# Patient Record
Sex: Male | Born: 1960 | Race: White | Hispanic: No | Smoking: Current some day smoker
Health system: Southern US, Community
[De-identification: ages and names within clinical notes are randomized; demographics above are authoritative.]

## PROBLEM LIST (undated history)

## (undated) ENCOUNTER — Emergency Department (HOSPITAL_COMMUNITY): Discharge status: Absent without leave | Payer: Medicaid Other | Source: Home / Self Care

---

## 2019-01-24 ENCOUNTER — Ambulatory Visit: Payer: Self-pay | Admitting: Internal Medicine

## 2019-05-17 ENCOUNTER — Ambulatory Visit: Payer: Self-pay | Attending: Internal Medicine

## 2019-05-17 DIAGNOSIS — Z23 Encounter for immunization: Secondary | ICD-10-CM

## 2019-05-17 NOTE — Progress Notes (Signed)
   [id number]  Name:  Romulo Okray.    MRN: [id number] DOB: January 21, 1961  05/17/2019  Mr. Adebayo was observed post Covid-19 immunization for 15 minutes without incident. He was provided with Vaccine Information Sheet and instruction to access the V-Safe system.   Mr. Bowsher was instructed to call 911 with any severe reactions post vaccine: Marland Kitchen Difficulty breathing  . Swelling of face and throat  . A fast heartbeat  . A bad rash all over body  . Dizziness and weakness   Immunizations Administered    Name Date Dose VIS Date Route   Pfizer COVID-19 Vaccine 05/17/2019  1:59 PM 0.3 mL 01/19/2019 Intramuscular   Manufacturer: [employer], [employer]   Lot: [id number]   NDC: [address]-0

## 2019-05-29 ENCOUNTER — Other Ambulatory Visit: Payer: Self-pay

## 2019-05-29 ENCOUNTER — Encounter (HOSPITAL_COMMUNITY): Payer: Self-pay | Admitting: Emergency Medicine

## 2019-05-29 ENCOUNTER — Emergency Department (HOSPITAL_COMMUNITY)
Admission: EM | Admit: 2019-05-29 | Discharge: 2019-05-29 | Disposition: A | Discharge status: Home / Self Care | Payer: Self-pay | Attending: Emergency Medicine | Admitting: Emergency Medicine

## 2019-05-29 DIAGNOSIS — M542 Cervicalgia: Secondary | ICD-10-CM | POA: Insufficient documentation

## 2019-05-29 DIAGNOSIS — Z5321 Procedure and treatment not carried out due to patient leaving prior to being seen by health care provider: Secondary | ICD-10-CM | POA: Insufficient documentation

## 2019-05-29 NOTE — ED Triage Notes (Signed)
Per pt, states he has a pinched nerve in his neck for over a month-left neck pain radiating down left arm-states his PCP gave him a script which helped-states increased pain

## 2019-06-11 ENCOUNTER — Ambulatory Visit: Payer: Self-pay | Attending: Internal Medicine

## 2019-06-11 DIAGNOSIS — Z23 Encounter for immunization: Secondary | ICD-10-CM

## 2019-06-11 NOTE — Progress Notes (Signed)
   [id number]  Name:  Dylan Duke.    MRN: [id number] DOB: 25-Apr-1960  06/11/2019  Mr. [name] was observed post Covid-19 immunization for 15 minutes without incident. He was provided with Vaccine Information Sheet and instruction to access the V-Safe system.   Mr. [name] was instructed to call 911 with any severe reactions post vaccine: Marland Kitchen Difficulty breathing  . Swelling of face and throat  . A fast heartbeat  . A bad rash all over body  . Dizziness and weakness   Immunizations Administered    Name Date Dose VIS Date Route   Pfizer COVID-19 Vaccine 06/11/2019  8:55 AM 0.3 mL 04/04/2018 Intramuscular   Manufacturer: [employer], [employer]   Lot: [id number]   NDC: [address]-4

## 2019-06-27 ENCOUNTER — Encounter (HOSPITAL_COMMUNITY): Payer: Self-pay | Admitting: Emergency Medicine

## 2019-06-27 ENCOUNTER — Other Ambulatory Visit: Payer: Self-pay

## 2019-06-27 ENCOUNTER — Emergency Department (HOSPITAL_COMMUNITY): Payer: Self-pay

## 2019-06-27 ENCOUNTER — Emergency Department (HOSPITAL_COMMUNITY)
Admission: EM | Admit: 2019-06-27 | Discharge: 2019-06-27 | Disposition: A | Discharge status: Home / Self Care | Payer: Self-pay | Attending: Emergency Medicine | Admitting: Emergency Medicine

## 2019-06-27 DIAGNOSIS — M5412 Radiculopathy, cervical region: Secondary | ICD-10-CM | POA: Insufficient documentation

## 2019-06-27 MED ORDER — PREDNISONE 10 MG PO TABS: 20.0000 mg | ORAL_TABLET | Freq: Every day | ORAL | 0 refills | Status: DC

## 2019-06-27 MED ORDER — TRAMADOL HCL 50 MG PO TABS: 50.0000 mg | ORAL_TABLET | Freq: Four times a day (QID) | ORAL | 0 refills | Status: DC | PRN

## 2019-06-27 NOTE — Discharge Instructions (Signed)
Follow-up with Dr. Maisie Fus or one of his colleagues at [facility] neurosurgery

## 2019-06-27 NOTE — ED Provider Notes (Signed)
[facility]-EMERGENCY DEPT Provider Note   CSN: [id number] Arrival date & time: 06/27/19  0935     History No chief complaint on file.   Dylan Duke. is a 59 y.o. male.  Patient states he has pain in the back of his neck rating down his left arm.  He has no weakness.  Patient complains of mild numbness  The history is provided by the patient. No language interpreter was used.  Hand Pain This is a new problem. The current episode started more than 2 days ago. The problem occurs constantly. The problem has not changed since onset.Pertinent negatives include no chest pain, no abdominal pain and no headaches. Nothing aggravates the symptoms. He has tried nothing for the symptoms. The treatment provided no relief.       History reviewed. No pertinent past medical history.  There are no problems to display for this patient.   History reviewed. No pertinent surgical history.     No family history on file.  Social History   Tobacco Use  . Smoking status: Not on file  Substance Use Topics  . Alcohol use: Not Currently  . Drug use: Never    Home Medications Prior to Admission medications   Medication Sig Start Date End Date Taking? Authorizing Provider  predniSONE (DELTASONE) 10 MG tablet Take 2 tablets (20 mg total) by mouth daily. 06/27/19   Bethann Berkshire, MD  traMADol (ULTRAM) 50 MG tablet Take 1 tablet (50 mg total) by mouth every 6 (six) hours as needed. 06/27/19   Bethann Berkshire, MD    Allergies    Penicillins  Review of Systems   Review of Systems  Constitutional: Negative for appetite change and fatigue.  HENT: Negative for congestion, ear discharge and sinus pressure.   Eyes: Negative for discharge.  Respiratory: Negative for cough.   Cardiovascular: Negative for chest pain.  Gastrointestinal: Negative for abdominal pain and diarrhea.  Genitourinary: Negative for frequency and hematuria.  Musculoskeletal: Negative for back pain.         Neck pain,, pain down left arm  Skin: Negative for rash.  Neurological: Negative for seizures and headaches.  Psychiatric/Behavioral: Negative for hallucinations.    Physical Exam Updated Vital Signs BP 137/79   Pulse (!) 53   Temp 97.6 F (36.4 C) (Oral)   Resp 17   SpO2 96%   Physical Exam Vitals and nursing note reviewed.  Constitutional:      Appearance: He is well-developed.  HENT:     Head: Normocephalic.     Nose: Nose normal.  Eyes:     General: No scleral icterus.    Conjunctiva/sclera: Conjunctivae normal.  Neck:     Thyroid: No thyromegaly.  Cardiovascular:     Rate and Rhythm: Normal rate and regular rhythm.     Heart sounds: No murmur. No friction rub. No gallop.   Pulmonary:     Breath sounds: No stridor. No wheezing or rales.  Chest:     Chest wall: No tenderness.  Abdominal:     General: There is no distension.     Tenderness: There is no abdominal tenderness. There is no rebound.  Musculoskeletal:        General: Normal range of motion.     Cervical back: Neck supple.  Lymphadenopathy:     Cervical: No cervical adenopathy.  Skin:    Findings: No erythema or rash.  Neurological:     Mental Status: He is alert and oriented to person,  place, and time.     Motor: No abnormal muscle tone.     Coordination: Coordination normal.  Psychiatric:        Behavior: Behavior normal.     ED Results / Procedures / Treatments   Labs (all labs ordered are listed, but only abnormal results are displayed) Labs Reviewed - No data to display  EKG None  Radiology DG Cervical Spine Complete  Result Date: 06/27/2019 CLINICAL DATA:  Pain to RIGHT side of neck and shoulder. No history of injury. EXAM: CERVICAL SPINE - COMPLETE 4+ VIEW COMPARISON:  None FINDINGS: Alignment is normal. Prevertebral soft tissues are unremarkable. Disc space narrowing is present at C4-5, C5-6 and C6-7 greatest at C6-7. Neural foraminal narrowing mild-to-moderate at C6-7 on the  LEFT and moderate to marked at C6-7 on the RIGHT. Facet arthropathy in the mid cervical spine greatest on the RIGHT. No sign of fracture or malalignment. IMPRESSION: Degenerative changes associated with neural foraminal narrowing the lower cervical spine bilaterally as described. Electronically Signed   By: Zetta Bills M.D.   On: 06/27/2019 10:48    Procedures Procedures (including critical care time)  Medications Ordered in ED Medications - No data to display  ED Course  I have reviewed the triage vital signs and the nursing notes.  Pertinent labs & imaging results that were available during my care of the patient were reviewed by me and considered in my medical decision making (see chart for details).    MDM Rules/Calculators/A&P                     Patient complains of left arm pain and numbness.  Cervical spine x-ray showed arthritis that could be causing a radiculopathy.  Patient put on prednisone Ultram and referred to neurosurgery      This patient presents to the ED for concern of neck pain, this involves an extensive number of treatment options, and is a complaint that carries with it a high risk of complications and morbidity.  The differential diagnosis includes cervical radiculopathy.  Cancer.   Lab Tests:   Medicines ordered:     Imaging Studies ordered:   I ordered imaging studies which included cervical spine films and  I independently visualized and interpreted imaging which showed arthritis  Additional history obtained:   Additional history obtained from records  Previous records obtained and reviewed   Consultations Obtained:   Reevaluation:  After the interventions stated above, I reevaluated the patient and found no change  Critical Interventions:  .   Final Clinical Impression(s) / ED Diagnoses Final diagnoses:  Cervical radiculopathy    Rx / DC Orders ED Discharge Orders         Ordered    predniSONE (DELTASONE) 10 MG tablet   Daily     06/27/19 1159    traMADol (ULTRAM) 50 MG tablet  Every 6 hours PRN     06/27/19 1159           Milton Ferguson, MD 06/27/19 1209

## 2019-06-27 NOTE — ED Triage Notes (Signed)
Pt reports pinched nerve in left side of neck that radiates to left shoulder x 2 months. Got meds from doctor for 5 days, reports he needed imaging if continued.

## 2019-06-29 ENCOUNTER — Other Ambulatory Visit (HOSPITAL_COMMUNITY): Payer: Self-pay | Admitting: Neurosurgery

## 2019-06-29 ENCOUNTER — Other Ambulatory Visit: Payer: Self-pay | Admitting: Neurosurgery

## 2019-06-29 DIAGNOSIS — M5412 Radiculopathy, cervical region: Secondary | ICD-10-CM

## 2019-07-03 ENCOUNTER — Ambulatory Visit: Payer: Self-pay | Attending: Neurosurgery | Admitting: Physical Therapy

## 2019-07-03 ENCOUNTER — Other Ambulatory Visit: Payer: Self-pay

## 2019-07-03 DIAGNOSIS — R293 Abnormal posture: Secondary | ICD-10-CM | POA: Insufficient documentation

## 2019-07-03 DIAGNOSIS — R252 Cramp and spasm: Secondary | ICD-10-CM | POA: Insufficient documentation

## 2019-07-03 DIAGNOSIS — M542 Cervicalgia: Secondary | ICD-10-CM | POA: Insufficient documentation

## 2019-07-04 ENCOUNTER — Encounter: Payer: Self-pay | Admitting: Physical Therapy

## 2019-07-04 NOTE — Patient Instructions (Signed)
Access Code: [facility] URL: [email or web address] Date: 07/03/2019 Prepared by: Dwana Curd  Exercises Seated Scalene Stretch with Towel - 1 x daily - 7 x weekly - 1 sets - 5 reps - 10 hold  Patient Education Trigger Point Dry Needling

## 2019-07-04 NOTE — Therapy (Signed)
[facility] [address], [place], [place], [address] Phone: [phone]   Fax:  [phone]  Physical Therapy Evaluation  Patient Details  Name: Dylan Duke. MRN: [id number] Date of Birth: [birth year] Referring Provider (PT): Vallarie Mare, MD   Encounter Date: 07/03/2019  PT End of Session - 07/04/19 1436    Visit Number  1    Date for PT Re-Evaluation  08/28/19    PT Start Time  1446    PT Stop Time  1526    PT Time Calculation (min)  40 min    Activity Tolerance  Patient tolerated treatment well    Behavior During Therapy  [facility] for tasks assessed/performed       History reviewed. No pertinent past medical history.  History reviewed. No pertinent surgical history.  There were no vitals filed for this visit.   Subjective Assessment - 07/03/19 1455    Subjective  Pt states it has not gotten better, just woke up like this about three and half months ago.    Limitations  Lifting    Patient Stated Goals  be able to do physical work so I can get a job    Currently in Pain?  Yes    Pain Score  5     Pain Location  Neck    Pain Orientation  Left    Pain Descriptors / Indicators  Constant    Pain Type  Acute pain    Pain Radiating Towards  down to shoulder    Pain Onset  More than a month ago    Pain Frequency  Constant    Aggravating Factors   doing yard work - weed eater and mowing, lifting and using his arm    Pain Relieving Factors  tilting head to the Rt feels better on the Left    Effect of Pain on Daily Activities  preventing from being able to get a job    Multiple Pain Sites  No         [facility] PT Assessment - 07/04/19 0001      Assessment   Medical Diagnosis  M54.12 (ICD-10-CM) - Radiculopathy, cervical region    Referring Provider (PT)  Vallarie Mare, MD    Onset Date/Surgical Date  --   started in March   Prior Therapy  No      Precautions   Precautions  None       Restrictions   Weight Bearing Restrictions  No      Balance Screen   Has the patient fallen in the past 6 months  No    Has the patient had a decrease in activity level because of a fear of falling?   No    Is the patient reluctant to leave their home because of a fear of falling?   No      Home Environment   Living Environment  Group home      Prior Function   Level of Independence  Independent    Vocation  Unemployed      Cognition   Overall Cognitive Status  Within Functional Limits for tasks assessed      Observation/Other Assessments   Focus on Therapeutic Outcomes (FOTO)   46% limited      Posture/Postural Control   Posture/Postural Control  Postural limitations    Postural Limitations  Forward head;Rounded Shoulders      ROM / Strength   AROM / PROM / Strength  AROM;Strength      AROM   AROM Assessment Site  Cervical    Cervical Flexion  60    Cervical Extension  60    Cervical - Right Side Bend  30+pain    Cervical - Left Side Bend  20 +pain    Cervical - Right Rotation  50 +pain    Cervical - Left Rotation  50      Strength   Overall Strength Comments  Lt side bend 4/5 +pain      Palpation   Palpation comment  tight and TTP Lt upper trap, scalenes with referred pain into shoulder; bilat SCM, Lt suboccipitals      Special Tests    Special Tests  Cervical    Cervical Tests  Spurling's;Dictraction      Spurling's   Findings  Positive    Side  Left      Distraction Test   Comment  no pain                  Objective measurements completed on examination: See above findings.      [facility] Adult PT Treatment/Exercise - 07/04/19 0001      Self-Care   Self-Care  Other Self-Care Comments    Other Self-Care Comments   initial HEP and dry needling aftercare             PT Education - 07/04/19 1438    Education Details  Access Code: [email or web address] with DN info    Person(s) Educated  Patient    Methods  Explanation;Demonstration;Verbal  cues;Handout    Comprehension  Verbalized understanding;Returned demonstration       PT Short Term Goals - 07/04/19 1444      PT SHORT TERM GOAL #1   Title  able to palpate the Lt scalenes and SCMs bilaterally without severe tenderness or increased symptoms due to decreased trigger points    Time  4    Period  Weeks    Status  New    Target Date  07/31/19      PT SHORT TERM GOAL #2   Title  ind with initial HEP    Time  4    Period  Weeks    Status  New    Target Date  07/31/19        PT Long Term Goals - 07/04/19 1439      PT LONG TERM GOAL #1   Title  Pt will demonstrate full head rotation without pain.    Time  8    Period  Weeks    Status  New    Target Date  08/28/19      PT LONG TERM GOAL #2   Title  Pt will be able to do at least 4 hours of manual activities such as yard work without increased pain    Time  8    Period  Weeks    Status  New    Target Date  08/28/19      PT LONG TERM GOAL #3   Title  ind with HEP to maintain ROM and posture strength    Time  8    Period  Weeks    Status  New    Target Date  08/28/19      PT LONG TERM GOAL #4   Title  FOTO < or = to 32% limited    Baseline  46%    Time  8    Period  Weeks    Status  New    Target Date  08/28/19      PT LONG TERM GOAL #5   Title  Reports at least 60% less pain    Time  8    Period  Weeks    Status  New    Target Date  08/28/19             Plan - 07/04/19 1703    Clinical Impression Statement  Pt presents to skilled PT due to flare up of chronic neck pain. Pt has shoulder ROM WNL.  He is positive for spurling to the Lt and pain is eased with distraction.  He keeps head in cervical SB to the Rt side to relieve the pain.  There is weakness and pain with Lt side bend.  Pt is TTP and tight Lt cervical, SCM, upper trap, and  scalenes with increased pain referred into shoulder with pressure on scalenes.  Pt has rounded shoulders and forward head.  Pt will benefit from skilled PT to  address impairments.  Discussed dry needling with patient and he seems to be okay with it in treatment plan    Personal Factors and Comorbidities  Time since onset of injury/illness/exacerbation    Examination-Participation Restrictions  Yard Work    PT Frequency  1x / week    PT Duration  8 weeks    PT Treatment/Interventions  ADLs/Self Care Home Management;Biofeedback;Cryotherapy;Electrical Stimulation;Traction;Ultrasound;Moist Heat;Therapeutic activities;Functional mobility training;Therapeutic exercise;Neuromuscular re-education;Patient/family education;Manual techniques;Passive range of motion;Dry needling;Taping    PT Next Visit Plan  dry needling Lt upper traps, bilat SCM, cervical paraspinal oblique capitus, posture and cervical ROM    PT Home Exercise Plan  Access Code: [email or web address] with DN info    Consulted and Agree with Plan of Care  Patient       Patient will benefit from skilled therapeutic intervention in order to improve the following deficits and impairments:  Pain, Postural dysfunction, Decreased strength, Increased muscle spasms  Visit Diagnosis: Cervicalgia  Abnormal posture  Cramp and spasm     Problem List There are no problems to display for this patient.   Junious Silk, PT 07/04/2019, 5:19 PM  [facility] [address], [address] [place], [place], [address] Phone: [phone]   Fax:  [phone]  Name: Dylan Duke. MRN: [id number] Date of Birth: January 06, 1961

## 2019-07-11 ENCOUNTER — Ambulatory Visit: Payer: Self-pay | Attending: Neurosurgery

## 2019-07-11 ENCOUNTER — Other Ambulatory Visit: Payer: Self-pay

## 2019-07-11 DIAGNOSIS — R252 Cramp and spasm: Secondary | ICD-10-CM | POA: Insufficient documentation

## 2019-07-11 DIAGNOSIS — M542 Cervicalgia: Secondary | ICD-10-CM | POA: Insufficient documentation

## 2019-07-11 DIAGNOSIS — R293 Abnormal posture: Secondary | ICD-10-CM | POA: Insufficient documentation

## 2019-07-11 NOTE — Therapy (Signed)
[facility] [address] [place], [place], [address] Phone: [phone]   Fax:  [phone]  Physical Therapy Treatment  Patient Details  Name: Dylan Duke. MRN: [id number] Date of Birth: Oct 30, 1960 Referring Provider (PT): Vallarie Mare, MD   Encounter Date: 07/11/2019  PT End of Session - 07/11/19 1102    Visit Number  2    Date for PT Re-Evaluation  08/28/19    PT Start Time  1020   heat at end   PT Stop Time  1103    PT Time Calculation (min)  43 min    Activity Tolerance  Patient tolerated treatment well    Behavior During Therapy  [facility] for tasks assessed/performed       History reviewed. No pertinent past medical history.  History reviewed. No pertinent surgical history.  There were no vitals filed for this visit.  Subjective Assessment - 07/11/19 1021    Subjective  My neck has been feeling terrible.    Currently in Pain?  Yes    Pain Score  6     Pain Location  Neck    Pain Orientation  Left    Pain Descriptors / Indicators  Constant    Pain Type  Acute pain    Pain Onset  More than a month ago    Pain Frequency  Constant    Aggravating Factors   it just hurts, no specific injury    Pain Relieving Factors  tilting head to the Rt                        [facility] Adult PT Treatment/Exercise - 07/11/19 0001      Modalities   Modalities  Moist Heat      Moist Heat Therapy   Number Minutes Moist Heat  10 Minutes    Moist Heat Location  Cervical      Manual Therapy   Manual Therapy  Soft tissue mobilization;Myofascial release    Manual therapy comments  elongation to bil neck and upper traps, PA and lateral mobs wtih gapping.  Passive stretch to the Rt into rotation and sidebending.        Trigger Point Dry Needling - 07/11/19 0001    Consent Given?  Yes    Education Handout Provided  Yes    Muscles Treated Head and Neck  Upper trapezius;Cervical  multifidi;Suboccipitals    Upper Trapezius Response  Twitch reponse elicited;Palpable increased muscle length    Suboccipitals Response  Twitch response elicited;Palpable increased muscle length    Cervical multifidi Response  Twitch reponse elicited;Palpable increased muscle length           PT Education - 07/11/19 1024    Education Details  DN info    Person(s) Educated  Patient    Methods  Explanation;Demonstration;Handout    Comprehension  Verbalized understanding;Returned demonstration       PT Short Term Goals - 07/04/19 1444      PT SHORT TERM GOAL #1   Title  able to palpate the Lt scalenes and SCMs bilaterally without severe tenderness or increased symptoms due to decreased trigger points    Time  4    Period  Weeks    Status  New    Target Date  07/31/19      PT SHORT TERM GOAL #2   Title  ind with initial HEP    Time  4    Period  Weeks    Status  New    Target Date  07/31/19        PT Long Term Goals - 07/04/19 1439      PT LONG TERM GOAL #1   Title  Pt will demonstrate full head rotation without pain.    Time  8    Period  Weeks    Status  New    Target Date  08/28/19      PT LONG TERM GOAL #2   Title  Pt will be able to do at least 4 hours of manual activities such as yard work without increased pain    Time  8    Period  Weeks    Status  New    Target Date  08/28/19      PT LONG TERM GOAL #3   Title  ind with HEP to maintain ROM and posture strength    Time  8    Period  Weeks    Status  New    Target Date  08/28/19      PT LONG TERM GOAL #4   Title  FOTO < or = to 32% limited    Baseline  46%    Time  8    Period  Weeks    Status  New    Target Date  08/28/19      PT LONG TERM GOAL #5   Title  Reports at least 60% less pain    Time  8    Period  Weeks    Status  New    Target Date  08/28/19            Plan - 07/11/19 1103    Clinical Impression Statement  Pt with first time follow-up after evaluation.  Pt denies any  change in pain since the start of care.  Pt continues to sit with Rt cervical sidebending and PT encouraged neutral seated posture.  Pt with increased Lt scapular symptoms with passive range to the Lt and relief of symptoms with sidebending to the Rt.  Pt with tension in bil cervical multifidi and upper traps.  Pt with improved mobility and reduced stiffness reported after manual therapy today.  Pt will continue to benefit from skilled PT to address neck pain and Lt UE radiculopathy.    PT Frequency  1x / week    PT Duration  8 weeks    PT Treatment/Interventions  ADLs/Self Care Home Management;Biofeedback;Cryotherapy;Electrical Stimulation;Traction;Ultrasound;Moist Heat;Therapeutic activities;Functional mobility training;Therapeutic exercise;Neuromuscular re-education;Patient/family education;Manual techniques;Passive range of motion;Dry needling;Taping    PT Next Visit Plan  cervical traction, add to HEP, assess response to dry needling    PT Home Exercise Plan  Access Code: [email or web address] with DN info    Consulted and Agree with Plan of Care  Patient       Patient will benefit from skilled therapeutic intervention in order to improve the following deficits and impairments:  Pain, Postural dysfunction, Decreased strength, Increased muscle spasms  Visit Diagnosis: Cervicalgia  Abnormal posture  Cramp and spasm     Problem List There are no problems to display for this patient.    Lorrene Reid, PT 07/11/19 11:05 AM  [facility] [address] [place], [place], [address] Phone: [phone]   Fax:  [phone]  Name: Dylan Duke. MRN: [id number] Date of Birth: [birth year]

## 2019-07-11 NOTE — Patient Instructions (Signed)

## 2019-07-17 ENCOUNTER — Other Ambulatory Visit: Payer: Self-pay

## 2019-07-17 ENCOUNTER — Ambulatory Visit: Payer: Self-pay

## 2019-07-17 DIAGNOSIS — M542 Cervicalgia: Secondary | ICD-10-CM

## 2019-07-17 DIAGNOSIS — R293 Abnormal posture: Secondary | ICD-10-CM

## 2019-07-17 DIAGNOSIS — R252 Cramp and spasm: Secondary | ICD-10-CM

## 2019-07-17 NOTE — Patient Instructions (Signed)
Access Code: [facility] URL: [email or web address] Date: 07/17/2019 Prepared by: Tresa Endo  Exercises  Seated Cervical Retraction - 3 x daily - 7 x weekly - 1 sets - 10 reps Supine Chin Tuck - 3 x daily - 7 x weekly - 1 sets - 10 reps Seated Scapular Retraction - 3 x daily - 7 x weekly - 1 sets - 10 reps Seated Correct Posture - 1 x daily - 7 x weekly - 10 reps - 3 sets  Patient Education Trigger Point Dry Needling

## 2019-07-17 NOTE — Therapy (Signed)
[facility] [address] [place], [place], [address] Phone: [phone]   Fax:  [phone]  Physical Therapy Treatment  Patient Details  Name: Dylan Duke. MRN: [id number] Date of Birth: [birth year] Referring Provider (PT): Vallarie Mare, MD   Encounter Date: 07/17/2019  PT End of Session - 07/17/19 0752    Visit Number  3    Date for PT Re-Evaluation  08/28/19    PT Start Time  0728    PT Stop Time  0800    PT Time Calculation (min)  32 min    Activity Tolerance  Patient tolerated treatment well    Behavior During Therapy  [facility] for tasks assessed/performed       History reviewed. No pertinent past medical history.  History reviewed. No pertinent surgical history.  There were no vitals filed for this visit.  Subjective Assessment - 07/17/19 0725    Subjective  It feels about the same.  I have an MRI scheduled on 07/23/19.    Currently in Pain?  Yes    Pain Score  6     Pain Location  Neck    Pain Orientation  Left    Pain Descriptors / Indicators  Constant;Aching    Pain Type  Acute pain    Pain Onset  More than a month ago    Pain Frequency  Constant    Aggravating Factors   it just hurts, trying to do anything especially yardwork.    Pain Relieving Factors  tilting head to the Rt                        [facility] Adult PT Treatment/Exercise - 07/17/19 0001      Posture/Postural Control   Posture Comments  posture education regarding alignment and foot placement      Exercises   Exercises  Neck      Neck Exercises: Seated   Neck Retraction  10 reps;5 secs    Other Seated Exercise  scap squeezes x10      Neck Exercises: Supine   Neck Retraction  10 reps;5 secs      Modalities   Modalities  Traction      Traction   Type of Traction  Cervical    Min (lbs)  5    Max (lbs)  16    Hold Time  60    Rest Time  10    Time  15             PT Education - 07/17/19  0738    Education Details  Access Code: [facility]    Person(s) Educated  Patient    Methods  Explanation;Demonstration;Handout    Comprehension  Verbalized understanding;Returned demonstration       PT Short Term Goals - 07/04/19 1444      PT SHORT TERM GOAL #1   Title  able to palpate the Lt scalenes and SCMs bilaterally without severe tenderness or increased symptoms due to decreased trigger points    Time  4    Period  Weeks    Status  New    Target Date  07/31/19      PT SHORT TERM GOAL #2   Title  ind with initial HEP    Time  4    Period  Weeks    Status  New    Target Date  07/31/19        PT  Long Term Goals - 07/04/19 1439      PT LONG TERM GOAL #1   Title  Pt will demonstrate full head rotation without pain.    Time  8    Period  Weeks    Status  New    Target Date  08/28/19      PT LONG TERM GOAL #2   Title  Pt will be able to do at least 4 hours of manual activities such as yard work without increased pain    Time  8    Period  Weeks    Status  New    Target Date  08/28/19      PT LONG TERM GOAL #3   Title  ind with HEP to maintain ROM and posture strength    Time  8    Period  Weeks    Status  New    Target Date  08/28/19      PT LONG TERM GOAL #4   Title  FOTO < or = to 32% limited    Baseline  46%    Time  8    Period  Weeks    Status  New    Target Date  08/28/19      PT LONG TERM GOAL #5   Title  Reports at least 60% less pain    Time  8    Period  Weeks    Status  New    Target Date  08/28/19            Plan - 07/17/19 0750    Clinical Impression Statement  Pt denies any change in symptoms with dry needling last session.  Session focused on postural education and exercise to improve alignment and postural strength.  Pt required frequent verbal and demo cues to improve alignment and relax upper traps with exercise today.  Trial of traction today to determine if helpful in reducing Lt UE/neck pain.  Pt tolerated well today.  Pt  will have MRI next week.  Pt with poor seated posture an Lt UE pain and will continue to benefit from skilled PT to address pain and allow for return to prior level of function.    PT Frequency  1x / week    PT Duration  8 weeks    PT Treatment/Interventions  ADLs/Self Care Home Management;Biofeedback;Cryotherapy;Electrical Stimulation;Traction;Ultrasound;Moist Heat;Therapeutic activities;Functional mobility training;Therapeutic exercise;Neuromuscular re-education;Patient/family education;Manual techniques;Passive range of motion;Dry needling;Taping    PT Next Visit Plan  assess response to traction, see if MRI results are back, continue postural strength    PT Home Exercise Plan  Access Code: [email or web address] with DN info    Recommended Other Services  initial cert has been signed    Consulted and Agree with Plan of Care  Patient       Patient will benefit from skilled therapeutic intervention in order to improve the following deficits and impairments:  Pain, Postural dysfunction, Decreased strength, Increased muscle spasms  Visit Diagnosis: Abnormal posture  Cervicalgia  Cramp and spasm     Problem List There are no problems to display for this patient.   Lorrene Reid, PT 07/17/19 7:54 AM  [facility] [address], [address] [place], [place], [address] Phone: [phone]   Fax:  [phone]  Name: Dylan Duke. MRN: [id number] Date of Birth: October 09, 1960

## 2019-07-23 ENCOUNTER — Ambulatory Visit (HOSPITAL_COMMUNITY)
Admission: RE | Admit: 2019-07-23 | Discharge: 2019-07-23 | Disposition: A | Discharge status: Home / Self Care | Payer: Self-pay | Source: Ambulatory Visit | Attending: Neurosurgery | Admitting: Neurosurgery

## 2019-07-23 ENCOUNTER — Other Ambulatory Visit: Payer: Self-pay

## 2019-07-23 DIAGNOSIS — M5412 Radiculopathy, cervical region: Secondary | ICD-10-CM | POA: Insufficient documentation

## 2019-07-26 ENCOUNTER — Ambulatory Visit: Payer: Self-pay

## 2019-07-26 ENCOUNTER — Other Ambulatory Visit: Payer: Self-pay

## 2019-07-26 DIAGNOSIS — R252 Cramp and spasm: Secondary | ICD-10-CM

## 2019-07-26 DIAGNOSIS — M542 Cervicalgia: Secondary | ICD-10-CM

## 2019-07-26 DIAGNOSIS — R293 Abnormal posture: Secondary | ICD-10-CM

## 2019-07-26 NOTE — Therapy (Addendum)
[facility] [address], [place], [place], [address] Phone: [phone]   Fax:  [phone]  Physical Therapy Treatment  Patient Details  Name: Dylan Duke. MRN: [id number] Date of Birth: [birth year] Referring Provider (PT): Vallarie Mare, MD   Encounter Date: 07/26/2019   PT End of Session - 07/26/19 0918    Visit Number 4    Date for PT Re-Evaluation 08/28/19    PT Start Time 0847    PT Stop Time 0933    PT Time Calculation (min) 46 min    Activity Tolerance Patient tolerated treatment well    Behavior During Therapy [facility] for tasks assessed/performed           History reviewed. No pertinent past medical history.  History reviewed. No pertinent surgical history.  There were no vitals filed for this visit.   Subjective Assessment - 07/26/19 0848    Subjective I feel about the same.  I got my MRI this week and I haven't gotten my results yet.    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 Yes    Pain Score 6     Pain Location Neck    Pain Orientation Left    Pain Descriptors / Indicators Aching;Constant    Pain Type Acute pain    Pain Onset More than a month ago    Pain Frequency Constant    Aggravating Factors  it just hurts, trying to do anything    Pain Relieving Factors tilting head to the Rt.                             [facility] Adult PT Treatment/Exercise - 07/26/19 0001      Neck Exercises: Machines for Strengthening   UBE (Upper Arm Bike) Level 1.5 x 7 minutes (3.5/3.5)      Neck Exercises: Supine   Neck Retraction 10 reps;5 secs    Other Supine Exercise ER with yellow band 2x10      Traction   Type of Traction Cervical    Min (lbs) 5    Max (lbs) 16    Hold Time 60    Rest Time 10    Time 15       Manual Therapy   Manual Therapy Soft tissue mobilization;Myofascial release    Manual therapy comments elongation to bil neck and upper traps, PA and lateral mobs wtih gapping.  Passive stretch to the Rt into rotation and sidebending.                     PT Short Term Goals - 07/26/19 0905      PT SHORT TERM GOAL #1   Title able to palpate the Lt scalenes and SCMs bilaterally without severe tenderness or increased symptoms due to decreased trigger points    Baseline --    Time 4    Period Weeks    Status Achieved      PT SHORT TERM GOAL #2   Title ind with initial HEP    Status Achieved             PT Long Term Goals - 07/04/19 1439      PT LONG TERM GOAL #1   Title Pt will demonstrate full  head rotation without pain.    Time 8    Period Weeks    Status New    Target Date 08/28/19      PT LONG TERM GOAL #2   Title Pt will be able to do at least 4 hours of manual activities such as yard work without increased pain    Time 8    Period Weeks    Status New    Target Date 08/28/19      PT LONG TERM GOAL #3   Title ind with HEP to maintain ROM and posture strength    Time 8    Period Weeks    Status New    Target Date 08/28/19      PT LONG TERM GOAL #4   Title FOTO < or = to 32% limited    Baseline 46%    Time 8    Period Weeks    Status New    Target Date 08/28/19      PT LONG TERM GOAL #5   Title Reports at least 60% less pain    Time 8    Period Weeks    Status New    Target Date 08/28/19                 Plan - 07/26/19 3716    Clinical Impression Statement Pt denies any change in symptoms with traction last session.  Session focused on postural education and exercise to improve alignment and postural strength.  Pt required frequent verbal and demo cues to improve alignment and relax upper traps with exercise today.  Pt with tension in Lt neck and upper traps/scapular that is significantly improved since the start of care and  demonstrated improved tissue mobility after manual therapy today.  Pt had MRI this week and PT advised him to call MD to get his results.  Pt will continue to benefit from skilled PT to promote neutral posture, UE strength, and manual/traction to address symptoms.    PT Frequency 1x / week    PT Duration 8 weeks    PT Treatment/Interventions ADLs/Self Care Home Management;Biofeedback;Cryotherapy;Electrical Stimulation;Traction;Ultrasound;Moist Heat;Therapeutic activities;Functional mobility training;Therapeutic exercise;Neuromuscular re-education;Patient/family education;Manual techniques;Passive range of motion;Dry needling;Taping    PT Next Visit Plan assess response to traction, see what MD says about MRI results, continue postural strength    PT Home Exercise Plan Access Code: [email or web address] with DN info    Consulted and Agree with Plan of Care Patient           Patient will benefit from skilled therapeutic intervention in order to improve the following deficits and impairments:  Pain, Postural dysfunction, Decreased strength, Increased muscle spasms  Visit Diagnosis: Abnormal posture  Cervicalgia  Cramp and spasm     Problem List There are no problems to display for this patient.    Sigurd Sos, PT 07/26/19 9:23 AM PHYSICAL THERAPY DISCHARGE SUMMARY  Visits from Start of Care: 4  Current functional level related to goals / functional outcomes: See above for current PT status.  Pt canceled all remaining appts.     Remaining deficits: See above   Education / Equipment: HEP, posture education Plan: Patient agrees to discharge.  Patient goals were not met. Patient is being discharged due to not returning since the last visit.  ?????        Sigurd Sos, PT 09/18/19 8:41 AM  [facility] [address], [place], [place], [address] Phone: [phone]  Fax:  [phone]  Name: Dylan Duke. MRN:  [id number] Date of Birth: 04-Aug-1960

## 2019-07-31 ENCOUNTER — Ambulatory Visit: Payer: Self-pay

## 2020-01-26 ENCOUNTER — Ambulatory Visit: Payer: Self-pay

## 2020-02-16 ENCOUNTER — Ambulatory Visit: Payer: Medicaid Other | Attending: Internal Medicine

## 2020-02-16 DIAGNOSIS — Z23 Encounter for immunization: Secondary | ICD-10-CM

## 2020-02-16 NOTE — Progress Notes (Signed)
   [facility]  Name:  Dylan Duke.    MRN: [id number] DOB: 1960-12-05  02/16/2020  Dylan Duke was observed post Covid-19 immunization for 15 minutes without incident. He was provided with Vaccine Information Sheet and instruction to access the V-Safe system.   Dylan Duke was instructed to call 911 with any severe reactions post vaccine: Marland Kitchen Difficulty breathing  . Swelling of face and throat  . A fast heartbeat  . A bad rash all over body  . Dizziness and weakness   Immunizations Administered    Name Date Dose VIS Date Route   Pfizer COVID-19 Vaccine 02/16/2020 11:34 AM 0.3 mL 11/28/2019 Intramuscular   Manufacturer: [employer], [employer]   Lot: [id number]   NDC: [address]-9

## 2020-03-09 ENCOUNTER — Encounter (HOSPITAL_COMMUNITY): Payer: Self-pay | Admitting: Emergency Medicine

## 2020-03-09 ENCOUNTER — Other Ambulatory Visit: Payer: Self-pay

## 2020-03-09 ENCOUNTER — Emergency Department (HOSPITAL_COMMUNITY)
Admission: EM | Admit: 2020-03-09 | Discharge: 2020-03-09 | Disposition: A | Discharge status: Home / Self Care | Payer: 59 | Attending: Emergency Medicine | Admitting: Emergency Medicine

## 2020-03-09 DIAGNOSIS — M545 Low back pain, unspecified: Secondary | ICD-10-CM | POA: Diagnosis present

## 2020-03-09 DIAGNOSIS — R000 Tachycardia, unspecified: Secondary | ICD-10-CM | POA: Insufficient documentation

## 2020-03-09 MED ORDER — KETOROLAC TROMETHAMINE 60 MG/2ML IM SOLN
60.0000 mg | Freq: Once | INTRAMUSCULAR | Status: AC
  Administered 2020-03-09: 60 mg via INTRAMUSCULAR
  Filled 2020-03-09: qty 2

## 2020-03-09 MED ORDER — METHOCARBAMOL 500 MG PO TABS: 500.0000 mg | ORAL_TABLET | Freq: Three times a day (TID) | ORAL | 0 refills | Status: DC | PRN

## 2020-03-09 NOTE — Discharge Instructions (Addendum)
You came to the emergency department to be evaluated for your back pain.  Your physical exam was reassuring.  You were given Toradol to help with your pain.  I have given you prescription for Robaxin please see following information below.  Please follow-up with the neurosurgeons or perform your surgery.  I have also given you information for a local orthopedic group please contact them for follow-up as well.    Please return to the emergency department if: You develop new bowel or bladder control problems. You have unusual weakness or numbness in your arms or legs. You develop nausea or vomiting. You develop abdominal pain. You feel faint.  Today you were prescribed Methocarbamol (Robaxin).  Methocarbamol (Robaxin) is used to treat muscle spasms/pain.  It works by helping to relax the muscles.  Drowsiness, dizziness, lightheadedness, stomach upset, nausea/vomiting, or blurred vision may occur.  Do not drive, use machinery, or do anything that needs alertness or clear vision until you can do it safely.  Do not combine this medication with alcoholic beverages, marijuana, or other central nervous system depressants.     Please take Ibuprofen (Advil, motrin) and Tylenol (acetaminophen) to relieve your pain.    You may take up to 600 MG (3 pills) of normal strength ibuprofen every 8 hours as needed.   You make take tylenol, up to 1,000 mg (two extra strength pills) every 8 hours as needed.   It is safe to take ibuprofen and tylenol at the same time as they work differently.   Do not take more than 3,000 mg tylenol in a 24 hour period (not more than one dose every 8 hours.  Please check all medication labels as many medications such as pain and cold medications may contain tylenol.  Do not drink alcohol while taking these medications.  Do not take other NSAID'S while taking ibuprofen (such as aleve or naproxen).  Please take ibuprofen with food to decrease stomach upset.

## 2020-03-09 NOTE — ED Triage Notes (Signed)
Patient c/o lower back pain today. Denies injury. Denies loss of bowel and bladder. Ambulatory. Recent neck surgery.

## 2020-03-09 NOTE — ED Provider Notes (Signed)
[facility]-EMERGENCY DEPT Provider Note   CSN: [id number] Arrival date & time: 03/09/20  1224     History Chief Complaint  Patient presents with  . Back Pain    Dylan Duke. is a 60 y.o. male.  Presents with a chief complaint of low back pain.  Patient reports that his low back pain began this morning after waking up, pain is located across bilateral lumbar back, rated 9/10 on the pain scale, worse with movement and standing, alleviating factors.  He denies any recent heavy lifting, falls or traumatic injuries.  Patient denies any IV drug use.  Denies any fevers, chills, bowel or bladder dysfunction, weakness, numbness, saddle anesthesia, chest pain, shortness of breath.    Per chart review patient had anterior cervical discectomy and fusion with plate J1-O8 on 41/66/0630; perfomed by Dr Mindi Junker.  Per chart review patient was seen on 02/18/20 for postop follow-up which indicated that he was doing well with no neck pain, arm pain, numbness or weakness.  Reports that he has been having thoracic back pain since December.  Pain is primarily with standing at work.  Patient reports that he stands for 8 to 9 hours while at work.  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  Denies any heavy lifting at work.  Patient reports that he has tried back braces, shoe inserts, lidocaine patches, all without improvement in his symptoms.  Reports that in the past he has never had low back pain until today.  Patient reports that his pain is no longer located in thoracic back only in the lumbar back.   HPI     History reviewed. No pertinent past medical history.  There are no problems to display for this patient.   History reviewed. No pertinent surgical history.     No family history on file.  Social History   Substance Use Topics  . Alcohol use: Not Currently  . Drug use: Never    Home Medications Prior to Admission medications   Medication  Sig Start Date End Date Taking? Authorizing Provider  methocarbamol (ROBAXIN) 500 MG tablet Take 1 tablet (500 mg total) by mouth every 8 (eight) hours as needed for muscle spasms. 03/09/20  Yes Haskel Schroeder, PA-C  predniSONE (DELTASONE) 10 MG tablet Take 2 tablets (20 mg total) by mouth daily. 06/27/19   Bethann Berkshire, MD  traMADol (ULTRAM) 50 MG tablet Take 1 tablet (50 mg total) by mouth every 6 (six) hours as needed. 06/27/19   Bethann Berkshire, MD    Allergies    Penicillins  Review of Systems   Review of Systems  Constitutional: Negative for chills and fever.  Eyes: Negative for visual disturbance.  Respiratory: Negative for shortness of breath.   Cardiovascular: Negative for chest pain.  Gastrointestinal: Negative for abdominal pain, nausea and vomiting.  Genitourinary: Negative for difficulty urinating and dysuria.  Musculoskeletal: Negative for back pain, neck pain and neck stiffness.  Skin: Negative for color change and rash.  Neurological: Negative for dizziness, tremors, seizures, syncope, facial asymmetry, speech difficulty, weakness, light-headedness, numbness and headaches.  Psychiatric/Behavioral: Negative for confusion.    Physical Exam Updated Vital Signs BP (!) 141/92 (BP Location: Left Arm)   Pulse (!) 106   Temp 97.8 F (36.6 C) (Oral)   Resp 16   SpO2 100%   Physical Exam Vitals and nursing note reviewed.  Constitutional:      General: He is not in acute distress.  Appearance: He is not ill-appearing, toxic-appearing or diaphoretic.  HENT:     Head: Normocephalic and atraumatic.     Mouth/Throat:     Pharynx: Oropharynx is clear. Uvula midline.  Eyes:     General: No scleral icterus.       Right eye: No discharge.        Left eye: No discharge.     Extraocular Movements: Extraocular movements intact.     Pupils: Pupils are equal, round, and reactive to light.  Cardiovascular:     Rate and Rhythm: Tachycardia present.  Pulmonary:      Effort: Pulmonary effort is normal.     Breath sounds: No stridor.  Abdominal:     Palpations: Abdomen is soft.     Tenderness: There is no abdominal tenderness.  Musculoskeletal:     Cervical back: Normal range of motion and neck supple. No deformity, rigidity, tenderness or bony tenderness. No spinous process tenderness or muscular tenderness. Normal range of motion.     Thoracic back: No deformity, tenderness or bony tenderness.     Lumbar back: No deformity, tenderness or bony tenderness. Negative right straight leg raise test and negative left straight leg raise test.  Skin:    General: Skin is warm and dry.     Coloration: Skin is not jaundiced or pale.  Neurological:     General: No focal deficit present.     Mental Status: He is alert.     GCS: GCS eye subscore is 4. GCS verbal subscore is 5. GCS motor subscore is 6.     Cranial Nerves: No cranial nerve deficit or facial asymmetry.     Sensory: Sensation is intact.     Motor: No weakness, tremor, seizure activity or pronator drift.     Coordination: Romberg sign negative. Finger-Nose-Finger Test normal.     Gait: Gait is intact. Gait normal.     Comments: CN II-XII intact, equal grip strength, +5 strength to bilateral upper and lower extremities    Psychiatric:        Behavior: Behavior is cooperative.     ED Results / Procedures / Treatments   Labs (all labs ordered are listed, but only abnormal results are displayed) Labs Reviewed - No data to display  EKG None  Radiology No results found.  Procedures Procedures   Medications Ordered in ED Medications  ketorolac (TORADOL) injection 60 mg (60 mg Intramuscular Given 03/09/20 1341)    ED Course  I have reviewed the triage vital signs and the nursing notes.  Pertinent labs & imaging results that were available during my care of the patient were reviewed by me and considered in my medical decision making (see chart for details).    MDM Rules/Calculators/A&P                           Alert 60 year old male in no acute distress, nontoxic-appearing.  Patient presents with chief complaint of low back pain.  Patient denies any extremity numbness or weakness, saddle anesthesia, bowel or bladder dysfunction, focal neurological deficit; less concerning for cauda equina syndrome.  No fevers, chills, IV drug use; less concerning for epidural abscess.  No recent falls or injuries, no deformity or tenderness to spinous process; less concerning traumatic injury.  Was noted to have mild tachycardia at 106 likely secondary to discomfort from his low back pain.  Patient was noted to be slightly hypertensive at 141/92; likely secondary to discomfort from  his low back pain however will follow with primary care provider for further assessment.    Patient was given Toradol injection.  Patient was unable to receive opiate pain medicine or muscle relaxers in emergency department because he needed to drive his scooter home.  He was given prescription for Robaxin.  Patient was advised to follow-up with neurosurgery, and orthopedic.  Patient was given strict return precautions.  Patient is understanding of all instructions and is in agreement with plan.    Final Clinical Impression(s) / ED Diagnoses Final diagnoses:  Acute bilateral low back pain without sciatica    Rx / DC Orders ED Discharge Orders         Ordered    methocarbamol (ROBAXIN) 500 MG tablet  Every 8 hours PRN        03/09/20 1344           Berneice Heinrich 03/09/20 1403    Jacalyn Lefevre, MD 03/09/20 [phone]

## 2020-03-15 ENCOUNTER — Ambulatory Visit (INDEPENDENT_AMBULATORY_CARE_PROVIDER_SITE_OTHER): Payer: 59

## 2020-03-15 ENCOUNTER — Ambulatory Visit (HOSPITAL_COMMUNITY)
Admission: EM | Admit: 2020-03-15 | Discharge: 2020-03-15 | Disposition: A | Discharge status: Home / Self Care | Payer: 59 | Attending: Family Medicine | Admitting: Family Medicine

## 2020-03-15 ENCOUNTER — Other Ambulatory Visit: Payer: Self-pay

## 2020-03-15 ENCOUNTER — Encounter (HOSPITAL_COMMUNITY): Payer: Self-pay

## 2020-03-15 DIAGNOSIS — M79661 Pain in right lower leg: Secondary | ICD-10-CM | POA: Diagnosis not present

## 2020-03-15 DIAGNOSIS — M5442 Lumbago with sciatica, left side: Secondary | ICD-10-CM | POA: Diagnosis not present

## 2020-03-15 DIAGNOSIS — M79662 Pain in left lower leg: Secondary | ICD-10-CM | POA: Diagnosis not present

## 2020-03-15 DIAGNOSIS — M5441 Lumbago with sciatica, right side: Secondary | ICD-10-CM | POA: Diagnosis not present

## 2020-03-15 DIAGNOSIS — M545 Low back pain, unspecified: Secondary | ICD-10-CM | POA: Diagnosis not present

## 2020-03-15 MED ORDER — METHOCARBAMOL 500 MG PO TABS: 500.0000 mg | ORAL_TABLET | Freq: Three times a day (TID) | ORAL | 0 refills | Status: DC | PRN

## 2020-03-15 MED ORDER — PREDNISONE 10 MG PO TABS: ORAL_TABLET | ORAL | 0 refills | Status: DC

## 2020-03-15 NOTE — ED Triage Notes (Signed)
Pt in with c/o constant lower back pain that has been going on for 3 months now and getting worse  Pt has been taking aspirin for sxs

## 2020-03-15 NOTE — ED Provider Notes (Signed)
[facility]    CSN: [id number] Arrival date & time: 03/15/20  1139      History   Chief Complaint Chief Complaint  Patient presents with  . Back Pain    HPI Dylan Duke. is a 60 y.o. male.   Here today with ongoing b/l low back pain with radiation down b/l legs x 1 week. States no known injury but standing for long periods such as at work seem to exacerbate it. Denies numbness, tingling, weakness down legs, bowel/bladder incontinence, fever, gait instability. Was seen last week for this here and given IM toradol and robaxin which he states did not resolve the issue. Has a hx of c spine disc issues s/p surgical repair.      History reviewed. No pertinent past medical history.  There are no problems to display for this patient.   History reviewed. No pertinent surgical history.     Home Medications    Prior to Admission medications   Medication Sig Start Date End Date Taking? Authorizing Provider  predniSONE (DELTASONE) 10 MG tablet Take 6 tabs day one, 5 tabs day two, 4 tabs day three, etc 03/15/20  Yes Particia Nearing, PA-C  methocarbamol (ROBAXIN) 500 MG tablet Take 1 tablet (500 mg total) by mouth every 8 (eight) hours as needed for muscle spasms. 03/15/20   Particia Nearing, PA-C  traMADol (ULTRAM) 50 MG tablet Take 1 tablet (50 mg total) by mouth every 6 (six) hours as needed. 06/27/19   Bethann Berkshire, MD    Family History History reviewed. No pertinent family history.  Social History Social History   Tobacco Use  . Smoking status: Current Some Day Smoker  . Smokeless tobacco: Never Used  Vaping Use  . Vaping Use: Some days  Substance Use Topics  . Alcohol use: Not Currently  . Drug use: Never     Allergies   Penicillins   Review of Systems Review of Systems PER HPI    Physical Exam Triage Vital Signs ED Triage Vitals  Enc Vitals Group     BP 03/15/20 1234 (!) 141/89     Pulse Rate 03/15/20 1232 94     Resp  03/15/20 1232 18     Temp 03/15/20 1232 98.5 F (36.9 C)     Temp src --      SpO2 03/15/20 1232 100 %     Weight --      Height --      Head Circumference --      Peak Flow --      Pain Score 03/15/20 1230 9     Pain Loc --      Pain Edu? --      Excl. in GC? --    No data found.  Updated Vital Signs BP (!) 141/89   Pulse 94   Temp 98.5 F (36.9 C)   Resp 18   SpO2 100%   Visual Acuity Right Eye Distance:   Left Eye Distance:   Bilateral Distance:    Right Eye Near:   Left Eye Near:    Bilateral Near:     Physical Exam Vitals and nursing note reviewed.  Constitutional:      Appearance: Normal appearance.  HENT:     Head: Atraumatic.  Eyes:     Extraocular Movements: Extraocular movements intact.     Conjunctiva/sclera: Conjunctivae normal.  Cardiovascular:     Rate and Rhythm: Normal rate and regular rhythm.  Heart sounds: Normal heart sounds.  Pulmonary:     Effort: Pulmonary effort is normal.     Breath sounds: Normal breath sounds.  Abdominal:     General: Bowel sounds are normal. There is no distension.     Palpations: Abdomen is soft.     Tenderness: There is no abdominal tenderness. There is no right CVA tenderness, left CVA tenderness or guarding.  Musculoskeletal:        General: Normal range of motion.     Cervical back: Normal range of motion and neck supple.     Comments: No midline ttp diffusely across thoracic and lumbar spine Questionable + SLR b/l Gait normal without limp  Skin:    General: Skin is warm and dry.  Neurological:     General: No focal deficit present.     Mental Status: He is oriented to person, place, and time.     Motor: No weakness.     Comments: B/l LEs appear neurovascularly intact  Psychiatric:        Mood and Affect: Mood normal.        Thought Content: Thought content normal.        Judgment: Judgment normal.     UC Treatments / Results  Labs (all labs ordered are listed, but only abnormal results are  displayed) Labs Reviewed - No data to display  EKG   Radiology DG Lumbar Spine Complete  Result Date: 03/15/2020 CLINICAL DATA:  Acute low back and bilateral lower extremity pain without known injury. EXAM: LUMBAR SPINE - COMPLETE 4+ VIEW COMPARISON:  None. FINDINGS: No fracture or spondylolisthesis is noted. Minimal degenerative disc disease is noted at L3-4 with anterior osteophyte formation. Remaining disc spaces are unremarkable. IMPRESSION: Minimal degenerative disc disease is noted at L3-4. No acute abnormality is noted. Electronically Signed   By: Lupita Raider M.D.   On: 03/15/2020 13:26    Procedures Procedures (including critical care time)  Medications Ordered in UC Medications - No data to display  Initial Impression / Assessment and Plan / UC Course  I have reviewed the triage vital signs and the nursing notes.  Pertinent labs & imaging results that were available during my care of the patient were reviewed by me and considered in my medical decision making (see chart for details).     X-ray low back showing mild DDD but no acute changes. Discussed need for MRI consideration given b/l sciatica and possible SLR findings on exam but will manage conservatively for now with prednisone taper, robaxin, rest, stretches. Neurosurgery f/u recommended.   Final Clinical Impressions(s) / UC Diagnoses   Final diagnoses:  Acute bilateral low back pain with bilateral sciatica   Discharge Instructions   None    ED Prescriptions    Medication Sig Dispense Auth. Provider   methocarbamol (ROBAXIN) 500 MG tablet  (Status: Discontinued) Take 1 tablet (500 mg total) by mouth every 8 (eight) hours as needed for muscle spasms. 20 tablet Particia Nearing, [place]   predniSONE (DELTASONE) 10 MG tablet Take 6 tabs day one, 5 tabs day two, 4 tabs day three, etc 21 tablet Particia Nearing, PA-C   methocarbamol (ROBAXIN) 500 MG tablet Take 1 tablet (500 mg total) by mouth every 8  (eight) hours as needed for muscle spasms. 20 tablet Particia Nearing, [place]     PDMP not reviewed this encounter.   Particia Nearing, [place] 03/15/20 1358

## 2020-03-25 ENCOUNTER — Other Ambulatory Visit: Payer: Self-pay | Admitting: Neurosurgery

## 2020-03-25 DIAGNOSIS — M5416 Radiculopathy, lumbar region: Secondary | ICD-10-CM

## 2020-04-16 ENCOUNTER — Ambulatory Visit: Payer: 59

## 2020-04-16 ENCOUNTER — Inpatient Hospital Stay: Admission: RE | Admit: 2020-04-16 | Payer: 59 | Source: Ambulatory Visit

## 2020-04-18 ENCOUNTER — Other Ambulatory Visit: Payer: Self-pay

## 2020-04-18 ENCOUNTER — Ambulatory Visit
Admission: RE | Admit: 2020-04-18 | Discharge: 2020-04-18 | Disposition: A | Discharge status: Home / Self Care | Payer: 59 | Source: Ambulatory Visit | Attending: Neurosurgery | Admitting: Neurosurgery

## 2020-04-18 DIAGNOSIS — M5416 Radiculopathy, lumbar region: Secondary | ICD-10-CM

## 2020-05-08 ENCOUNTER — Ambulatory Visit: Payer: 59

## 2020-12-10 ENCOUNTER — Encounter (HOSPITAL_COMMUNITY): Payer: Self-pay | Admitting: Emergency Medicine

## 2020-12-10 ENCOUNTER — Emergency Department (HOSPITAL_COMMUNITY)
Admission: EM | Admit: 2020-12-10 | Discharge: 2020-12-10 | Disposition: A | Discharge status: Home / Self Care | Payer: 59 | Attending: Emergency Medicine | Admitting: Emergency Medicine

## 2020-12-10 DIAGNOSIS — U071 COVID-19: Secondary | ICD-10-CM | POA: Diagnosis not present

## 2020-12-10 DIAGNOSIS — F17200 Nicotine dependence, unspecified, uncomplicated: Secondary | ICD-10-CM | POA: Insufficient documentation

## 2020-12-10 DIAGNOSIS — J069 Acute upper respiratory infection, unspecified: Secondary | ICD-10-CM | POA: Insufficient documentation

## 2020-12-10 DIAGNOSIS — R059 Cough, unspecified: Secondary | ICD-10-CM | POA: Diagnosis present

## 2020-12-10 LAB — RESP PANEL BY RT-PCR (FLU A&B, COVID) ARPGX2
Influenza A by PCR: NEGATIVE
Influenza B by PCR: NEGATIVE
SARS Coronavirus 2 by RT PCR: POSITIVE — AB

## 2020-12-10 MED ORDER — BENZONATATE 100 MG PO CAPS: 100.0000 mg | ORAL_CAPSULE | Freq: Three times a day (TID) | ORAL | 0 refills | Status: DC

## 2020-12-10 NOTE — ED Notes (Signed)
An After Visit Summary was printed and given to the patient. Discharge instructions given and no further questions at this time.  

## 2020-12-10 NOTE — Discharge Instructions (Signed)
Please read and follow all provided instructions.  Your diagnoses today include:  1. Viral URI with cough     Tests performed today include: Covid/flu test pending Vital signs. See below for your results today.   Medications prescribed:  Tessalon Perles - cough suppressant medication  Take any prescribed medications only as directed.  Home care instructions:  Follow any educational materials contained in this packet. Please continue drinking plenty of fluids. Use over-the-counter cold and flu medications as needed as directed on packaging for symptom relief. You may also use ibuprofen or tylenol as directed on packaging for pain or fever.   BE VERY CAREFUL not to take multiple medicines containing Tylenol (also called acetaminophen). Doing so can lead to an overdose which can damage your liver and cause liver failure and possibly death.   Follow-up instructions: Please follow-up with your primary care provider in the next 3 days for further evaluation of your symptoms.   Return instructions:  Please return to the Emergency Department if you experience worsening symptoms. Please return if you have a high fever greater than 101 degrees not controlled with over-the-counter medications, persistent vomiting and cannot keep down fluids, or worsening trouble breathing. Please return if you have any other emergent concerns.  Additional Information:  Your vital signs today were: BP 140/79 (BP Location: Left Arm)   Pulse 96   Temp (!) 97.4 F (36.3 C) (Oral)   Resp 16   Ht 5\' 11"  (1.803 m)   Wt 68 kg   SpO2 98%   BMI 20.92 kg/m  If your blood pressure (BP) was elevated above 135/85 this visit, please have this repeated by your doctor within one month.

## 2020-12-10 NOTE — ED Provider Notes (Signed)
[facility] Provider Note   CSN: [id number] Arrival date & time: 12/10/20  4098     History Chief Complaint  Patient presents with   URI    Dylan Warnell. is a 59 y.o. male.  Patient with no past pulmonary history presents to the emergency department for evaluation of URI symptoms.  Symptoms started 3 days ago.  He describes having a productive cough, sweats and chills.  No definite fevers.  He has been fatigued and tired.  He has been using over-the-counter medications without improvement.  No known sick contacts.  He states that he is concerned that he could lose his job if he misses any work.  Onset of symptoms acute.  Course is constant.      History reviewed. No pertinent past medical history.  There are no problems to display for this patient.   History reviewed. No pertinent surgical history.     No family history on file.  Social History   Tobacco Use   Smoking status: Some Days   Smokeless tobacco: Never  Vaping Use   Vaping Use: Some days  Substance Use Topics   Alcohol use: Not Currently   Drug use: Never    Home Medications Prior to Admission medications   Medication Sig Start Date End Date Taking? Authorizing Provider  benzonatate (TESSALON) 100 MG capsule Take 1 capsule (100 mg total) by mouth every 8 (eight) hours. 12/10/20  Yes Renne Crigler, PA-C  predniSONE (DELTASONE) 10 MG tablet Take 6 tabs day one, 5 tabs day two, 4 tabs day three, etc 03/15/20   Particia Nearing, PA-C    Allergies    Penicillins  Review of Systems   Review of Systems  Constitutional:  Positive for chills and fatigue. Negative for fever.  HENT:  Positive for congestion and sore throat. Negative for ear pain, rhinorrhea and sinus pressure.   Eyes:  Negative for redness.  Respiratory:  Positive for cough. Negative for shortness of breath and wheezing.   Gastrointestinal:  Negative for abdominal pain, diarrhea, nausea and  vomiting.  Genitourinary:  Negative for dysuria.  Musculoskeletal:  Positive for myalgias. Negative for neck stiffness.  Skin:  Negative for rash.  Neurological:  Negative for headaches.  Hematological:  Negative for adenopathy.   Physical Exam Updated Vital Signs BP 140/79 (BP Location: Left Arm)   Pulse 96   Temp (!) 97.4 F (36.3 C) (Oral)   Resp 16   Ht 5\' 11"  (1.803 m)   Wt 68 kg   SpO2 98%   BMI 20.92 kg/m   Physical Exam Vitals and nursing note reviewed.  Constitutional:      Appearance: He is well-developed.  HENT:     Head: Normocephalic and atraumatic.     Jaw: No trismus.     Right Ear: Tympanic membrane, ear canal and external ear normal.     Left Ear: Tympanic membrane, ear canal and external ear normal.     Nose: Nose normal. No mucosal edema or rhinorrhea.     Mouth/Throat:     Mouth: Mucous membranes are not dry.     Pharynx: Uvula midline. Posterior oropharyngeal erythema present. No oropharyngeal exudate or uvula swelling.     Tonsils: No tonsillar abscesses.  Eyes:     General:        Right eye: No discharge.        Left eye: No discharge.     Conjunctiva/sclera: Conjunctivae normal.  Cardiovascular:  Rate and Rhythm: Normal rate and regular rhythm.     Heart sounds: Normal heart sounds.  Pulmonary:     Effort: Pulmonary effort is normal. No respiratory distress.     Breath sounds: Normal breath sounds. No wheezing or rales.  Abdominal:     Palpations: Abdomen is soft.     Tenderness: There is no abdominal tenderness.  Musculoskeletal:     Cervical back: Normal range of motion and neck supple.  Skin:    General: Skin is warm and dry.  Neurological:     Mental Status: He is alert.    ED Results / Procedures / Treatments   Labs (all labs ordered are listed, but only abnormal results are displayed) Labs Reviewed  RESP PANEL BY RT-PCR (FLU A&B, COVID) [id number]    EKG None  Radiology No results found.  Procedures Procedures    Medications Ordered in ED Medications - No data to display  ED Course  I have reviewed the triage vital signs and the nursing notes.  Pertinent labs & imaging results that were available during my care of the patient were reviewed by me and considered in my medical decision making (see chart for details).  Patient seen and examined. Work-up initiated --we will check flu/COVID testing.  Otherwise appears well, in no acute distress.  Vital signs reviewed and are as follows: BP 140/79 (BP Location: Left Arm)   Pulse 96   Temp (!) 97.4 F (36.3 C) (Oral)   Resp 16   Ht 5\' 11"  (1.803 m)   Wt 68 kg   SpO2 98%   BMI 20.92 kg/m   12:27 PM patient given a prescription for Tessalon as well as a work note.  Patient discharged to home. Encouraged to rest and drink plenty of fluids.  Patient told to return to ED or see their primary doctor if their symptoms worsen, high fever not controlled with tylenol, persistent vomiting, they feel they are dehydrated, or if they have any other concerns.  Patient verbalized understanding and agreed with plan.      MDM Rules/Calculators/A&P                           Patient with symptoms consistent with influenza.  No chest pain or shortness of breath to suggest pneumonia, ACS, PE.  Vitals are stable, low-grade fever. No signs of dehydration, tolerating PO's. Lungs are clear. Supportive therapy indicated with return if symptoms worsen. Patient counseled.  Final Clinical Impression(s) / ED Diagnoses Final diagnoses:  Viral URI with cough    Rx / DC Orders ED Discharge Orders          Ordered    benzonatate (TESSALON) 100 MG capsule  Every 8 hours        12/10/20 1213             13/02/22, PA-C 12/10/20 1228    13/02/22, MD 12/10/20 [phone]

## 2020-12-10 NOTE — ED Triage Notes (Signed)
Per pt, states he states he has had cold and flu symptoms since Sunday-has not taking any OTC meds-congestion, cough, fatigue

## 2021-02-19 ENCOUNTER — Other Ambulatory Visit: Payer: Self-pay | Admitting: Family Medicine

## 2021-02-19 DIAGNOSIS — R002 Palpitations: Secondary | ICD-10-CM

## 2021-02-25 ENCOUNTER — Ambulatory Visit (INDEPENDENT_AMBULATORY_CARE_PROVIDER_SITE_OTHER): Payer: 59

## 2021-02-25 DIAGNOSIS — R002 Palpitations: Secondary | ICD-10-CM

## 2021-03-16 ENCOUNTER — Ambulatory Visit (INDEPENDENT_AMBULATORY_CARE_PROVIDER_SITE_OTHER): Payer: 59 | Admitting: Orthopaedic Surgery

## 2021-03-16 ENCOUNTER — Ambulatory Visit (INDEPENDENT_AMBULATORY_CARE_PROVIDER_SITE_OTHER): Payer: 59

## 2021-03-16 ENCOUNTER — Other Ambulatory Visit: Payer: Self-pay

## 2021-03-16 DIAGNOSIS — M25512 Pain in left shoulder: Secondary | ICD-10-CM | POA: Diagnosis not present

## 2021-03-16 DIAGNOSIS — G8929 Other chronic pain: Secondary | ICD-10-CM | POA: Diagnosis not present

## 2021-03-16 MED ORDER — METHYLPREDNISOLONE ACETATE 40 MG/ML IJ SUSP
40.0000 mg | INTRAMUSCULAR | Status: AC | PRN
  Administered 2021-03-16: 40 mg via INTRA_ARTICULAR

## 2021-03-16 MED ORDER — LIDOCAINE HCL 1 % IJ SOLN
3.0000 mL | INTRAMUSCULAR | Status: AC | PRN
  Administered 2021-03-16: 3 mL

## 2021-03-16 NOTE — Progress Notes (Signed)
Office Visit Note   Patient: Dylan Duke.           Date of Birth: March 12, 1960           MRN: [id number] Visit Date: 03/16/2021              Requested by: Clementeen Graham, PA-C [address] [place],  [place] [address] PCP: Clementeen Graham, PA-C   Assessment & Plan: Visit Diagnoses:  1. Chronic left shoulder pain     Plan: Based on his clinical exam, I would recommend a steroid injection in the left shoulder subacromial outlet and see how he does with that.  The only true weakness with some with liftoff but the remainder of her shoulder exam is pretty good in terms of strength and not a lot of pain.  I did recommend him trying the steroid injection since it helped him years ago and he agreed to this and tolerated it well.  I told him that follow-up can be as needed.  My next step would be considering a MRI of the left shoulder rule out rotator cuff tear if he continues to have issues.  Follow-Up Instructions: Return if symptoms worsen or fail to improve.   Orders:  Orders Placed This Encounter  Procedures   Large Joint Inj   XR Shoulder Left   No orders of the defined types were placed in this encounter.     Procedures: Large Joint Inj: L subacromial bursa on 03/16/2021 4:26 PM Indications: pain and diagnostic evaluation Details: 22 G 1.5 in needle  Arthrogram: No  Medications: 3 mL lidocaine 1 %; 40 mg methylPREDNISolone acetate 40 MG/ML Outcome: tolerated well, no immediate complications Procedure, treatment alternatives, risks and benefits explained, specific risks discussed. Consent was given by the patient. Immediately prior to procedure a time out was called to verify the correct patient, procedure, equipment, support staff and site/side marked as required. Patient was prepped and draped in the usual sterile fashion.      Clinical Data: No additional findings.   Subjective: Chief Complaint  Patient presents with   Left Shoulder - Pain  The  patient comes in today with left shoulder pain has been chronic.  He said he had an injection in that shoulder years ago that did help.  He denies any specific injury.  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  It does not wake him up at night.  Has been more of a dull aching pain with his left shoulder.  HPI  Review of Systems He currently denies any headache, chest pain, shortness of breath, fever, chills, nausea, vomiting  Objective: Vital Signs: There were no vitals taken for this visit.  Physical Exam He is alert and orient x3 and in no acute distress Ortho Exam He actually has pretty good range of motion of his left shoulder and its almost full.  He has some pain around the subacromial outlet in his left shoulder and he does have a weak liftoff exam. Specialty Comments:  No specialty comments available.  Imaging: XR Shoulder Left  Result Date: 03/16/2021 3 days of left shoulder show no acute findings.  Shoulder is well located.  There is mild narrowing at the [facility] joint.    PMFS History: There are no problems to display for this patient.  No past medical history on file.  No family history on file.  No past surgical history on file. Social History  Occupational History   Not on file  Tobacco Use   Smoking status: Some Days   Smokeless tobacco: Never  Vaping Use   Vaping Use: Some days  Substance and Sexual Activity   Alcohol use: Not Currently   Drug use: Never   Sexual activity: Not on file

## 2021-07-27 IMAGING — CR DG CERVICAL SPINE COMPLETE 4+V
5 series · 5 of 5 positions shown · non-contrast
Comparison: None

CLINICAL DATA: Pain to RIGHT side of neck and shoulder. No history
of injury.

EXAM:
CERVICAL SPINE - COMPLETE 4+ VIEW

[w cervical spine lat]
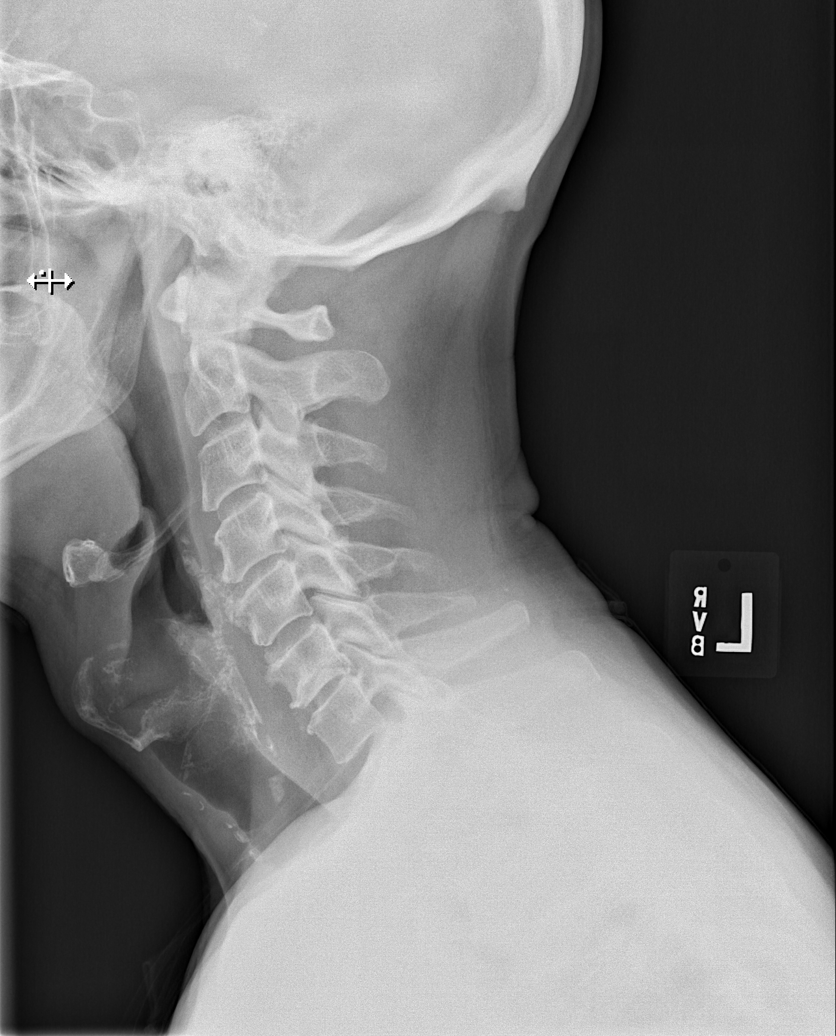

[w cervical spine ap_obl (1 of 2)]
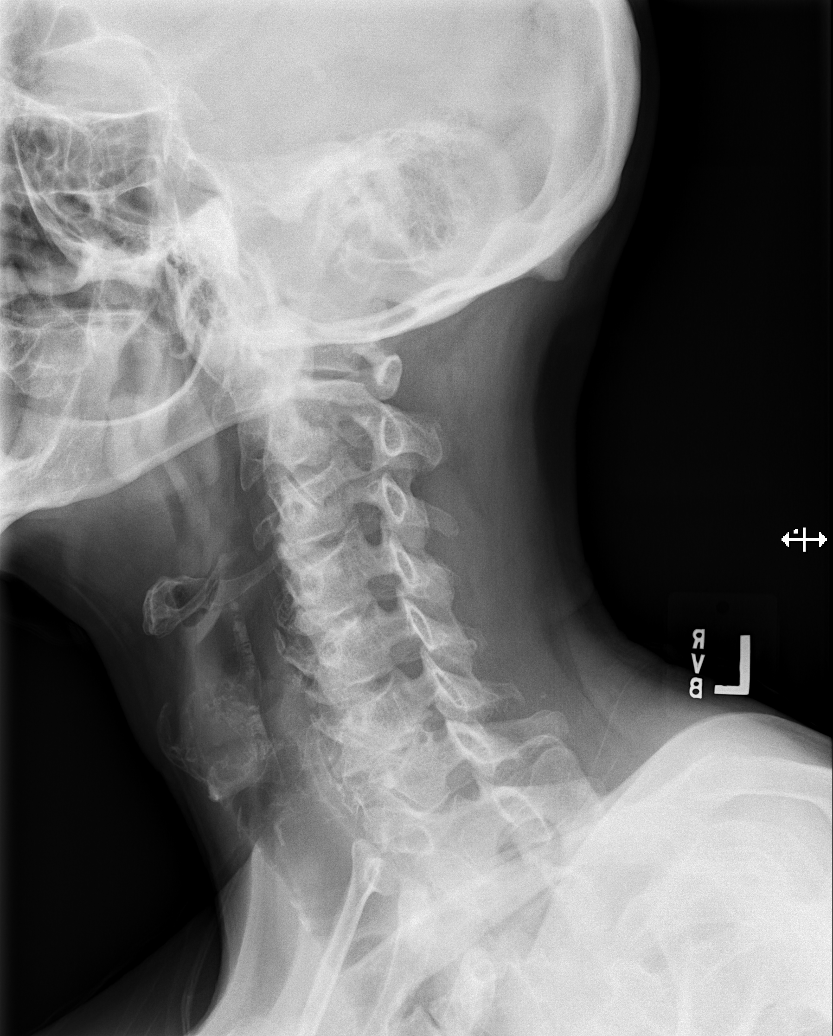

[w cervical spine ap_obl (2 of 2)]
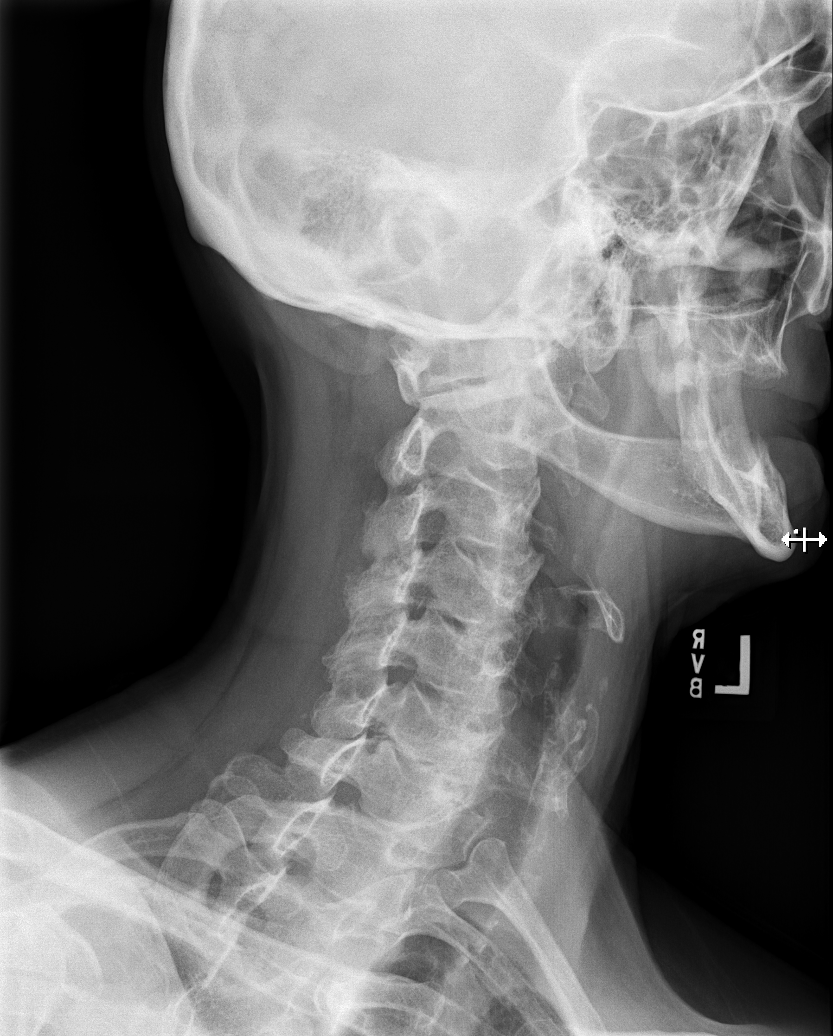

[w cervical spine ap]
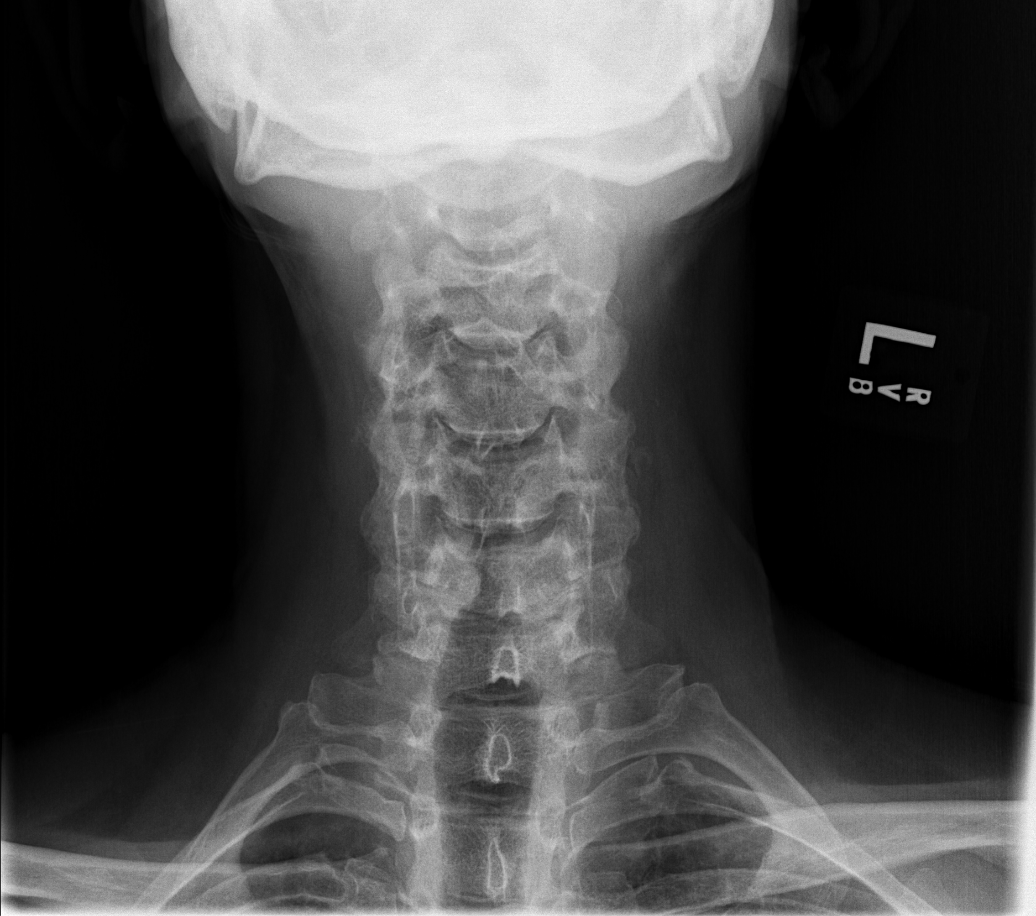

[w cervical spine odontoid]
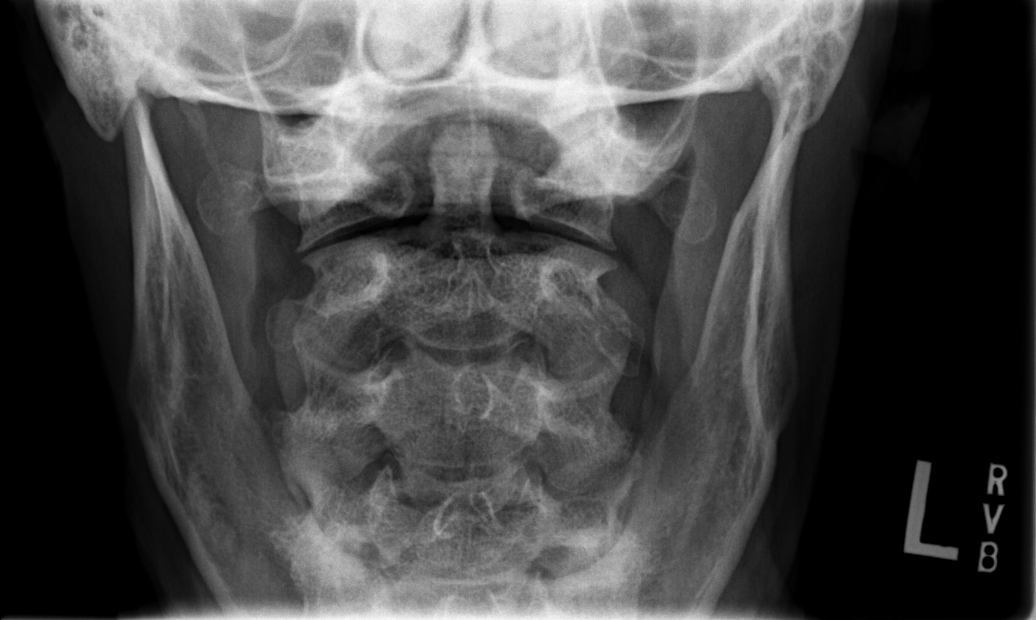

[5 of 5 positions shown; findings below may reference images not displayed]

FINDINGS: Alignment is normal. Prevertebral soft tissues are unremarkable.

Disc space narrowing is present at C4-5, C5-6 and C6-7 greatest at
C6-7.

Neural foraminal narrowing mild-to-moderate at C6-7 on the LEFT and
moderate to marked at C6-7 on the RIGHT.

Facet arthropathy in the mid cervical spine greatest on the RIGHT.

No sign of fracture or malalignment.
IMPRESSION: Degenerative changes associated with neural foraminal narrowing the
lower cervical spine bilaterally as described.

## 2021-08-20 ENCOUNTER — Emergency Department (HOSPITAL_COMMUNITY)
Admission: EM | Admit: 2021-08-20 | Discharge: 2021-08-20 | Disposition: A | Discharge status: Home / Self Care | Payer: 59 | Attending: Emergency Medicine | Admitting: Emergency Medicine

## 2021-08-20 ENCOUNTER — Encounter (HOSPITAL_COMMUNITY): Payer: Self-pay

## 2021-08-20 ENCOUNTER — Other Ambulatory Visit: Payer: Self-pay

## 2021-08-20 DIAGNOSIS — F1010 Alcohol abuse, uncomplicated: Secondary | ICD-10-CM | POA: Insufficient documentation

## 2021-08-20 MED ORDER — CHLORDIAZEPOXIDE HCL 25 MG PO CAPS: ORAL_CAPSULE | ORAL | 0 refills | Status: DC

## 2021-08-20 NOTE — ED Provider Notes (Signed)
[facility] [address]-EMERGENCY DEPT Provider Note   CSN: [id number] Arrival date & time: 08/20/21  1921    History  Chief Complaint  Patient presents with   Alcohol Intoxication    Dylan Nealy. is a 61 y.o. male here for evaluation of requesting medication to help with alcohol withdrawal.  Patient states he is a chronic EtOH user.  Drinks daily.  7-8 beers daily.  States he was sober about 3 years ago however he quickly relapsed.  States he thinks he would like to abstain from alcohol however he would like resources before deciding.  States he has needed Librium previously.  He denies prior history of DTs or withdrawal seizures.  Patient states he last drank PTA.  Called EMS to come here for evaluation.  He was eating and drinking normally with EMS, ambulated from his apartment upstairs down to the EMS truck and into the ED without difficulty.  He denies any headache, nausea, vomiting, chest pain, shortness breath abdominal pain, diarrhea, dysuria.  He denies SI, HI, AVH.  Patient states he is strictly here for resources.  HPI     Home Medications Prior to Admission medications   Medication Sig Start Date End Date Taking? Authorizing Provider  chlordiazePOXIDE (LIBRIUM) 25 MG capsule 50mg  PO TID x 1D, then 25-50mg  PO BID X 1D, then 25-50mg  PO QD X 1D 08/20/21  Yes Clio Gerhart A, PA-C  benzonatate (TESSALON) 100 MG capsule Take 1 capsule (100 mg total) by mouth every 8 (eight) hours. 12/10/20   13/2/22, PA-C  predniSONE (DELTASONE) 10 MG tablet Take 6 tabs day one, 5 tabs day two, 4 tabs day three, etc 03/15/20   05/13/20, PA-C      Allergies    Penicillins    Review of Systems   Review of Systems  Constitutional: Negative.   HENT: Negative.    Respiratory: Negative.    Cardiovascular: Negative.   Gastrointestinal: Negative.   Genitourinary: Negative.   Musculoskeletal: Negative.   Skin: Negative.   All other systems reviewed and are  negative.   Physical Exam Updated Vital Signs BP (!) 139/92 (BP Location: Right Arm)   Pulse (!) 102   Temp 98.4 F (36.9 C) (Oral)   Resp 16   Ht 5\' 11"  (1.803 m)   Wt 63.5 kg   SpO2 95%   BMI 19.53 kg/m  Physical Exam Vitals and nursing note reviewed.  Constitutional:      General: He is not in acute distress.    Appearance: He is well-developed. He is not ill-appearing, toxic-appearing or diaphoretic.  HENT:     Head: Normocephalic and atraumatic.     Mouth/Throat:     Mouth: Mucous membranes are moist.  Eyes:     Pupils: Pupils are equal, round, and reactive to light.  Cardiovascular:     Rate and Rhythm: Normal rate and regular rhythm.     Pulses: Normal pulses.     Heart sounds: Normal heart sounds.  Pulmonary:     Effort: Pulmonary effort is normal. No respiratory distress.     Breath sounds: Normal breath sounds.  Abdominal:     General: Bowel sounds are normal. There is no distension.     Palpations: Abdomen is soft.     Tenderness: There is no abdominal tenderness. There is no right CVA tenderness, left CVA tenderness, guarding or rebound.  Musculoskeletal:        General: Normal range of motion.  Cervical back: Normal range of motion and neck supple.  Skin:    General: Skin is warm and dry.     Capillary Refill: Capillary refill takes less than 2 seconds.  Neurological:     General: No focal deficit present.     Mental Status: He is alert and oriented to person, place, and time.     Comments: Alert to person, place, time Follows commands Ambulatory that ataxic gait Equal grip bilaterally Intact sensation No slurred speech  Psychiatric:        Mood and Affect: Mood normal.        Behavior: Behavior normal.        Thought Content: Thought content normal.        Judgment: Judgment normal.     Comments: Denies SI, HI, AVH.    ED Results / Procedures / Treatments   Labs (all labs ordered are listed, but only abnormal results are displayed) Labs  Reviewed - No data to display  EKG None  Radiology No results found.  Procedures Procedures    Medications Ordered in ED Medications - No data to display  ED Course/ Medical Decision Making/ A&P    61 year old for evaluation of requesting resources for alcohol abuse.  He is a daily drinker.  Denies history of DTs, withdrawal seizures.  Has needed Librium previously which he states he did well.  Patient on arrival with EMS is ambulatory, tolerating p.o. intake.  He has no slurred speech.  He has no evidence of EtOH withdrawal at this time.  No tremors.  States he is strictly here for resources  Patient does not want labs drawn.  States he would just like resources for outpatient follow-up for alcohol abuse.  States he is on Librium previously and has tolerated well.  He denies prior history of DTs, withdrawal seizures.  This time he is eating and drinking without difficulty.  Ambulating around the emergency department without any difficulty.  He is ANO x3.  Denies SI, HI, AVH.  Patient does not want inpatient treatment and he prefers outpatient treatment.  Patient given resources for EtOH abuse outpatient as well as Librium prescription.  Encouraged him if he changes his mind about labs obtained any new or worsening symptoms to please return.  He is agreeable.  The patient has been appropriately medically screened and/or stabilized in the ED. I have low suspicion for any other emergent medical condition which would require further screening, evaluation or treatment in the ED or require inpatient management.  Patient is hemodynamically stable and in no acute distress.  Patient able to ambulate in department prior to ED.  Evaluation does not show acute pathology that would require ongoing or additional emergent interventions while in the emergency department or further inpatient treatment.  I have discussed the diagnosis with the patient and answered all questions.  Pain is been managed while  in the emergency department and patient has no further complaints prior to discharge.  Patient is comfortable with plan discussed in room and is stable for discharge at this time.  I have discussed strict return precautions for returning to the emergency department.  Patient was encouraged to follow-up with PCP/specialist refer to at discharge.                            Medical Decision Making Amount and/or Complexity of Data Reviewed Independent Historian: EMS External Data Reviewed: labs, radiology and notes.  Risk OTC drugs.  Prescription drug management. Diagnosis or treatment significantly limited by social determinants of health.           Final Clinical Impression(s) / ED Diagnoses Final diagnoses:  ETOH abuse    Rx / DC Orders ED Discharge Orders          Ordered    chlordiazePOXIDE (LIBRIUM) 25 MG capsule        08/20/21 2002              Mirah Nevins A, PA-C 08/20/21 2043    Jacalyn Lefevre, MD 08/20/21 [phone]

## 2021-08-20 NOTE — ED Notes (Signed)
An After Visit Summary was printed and given to the patient. Discharge instructions given and no further questions at this time.  Pt refused discharge VS. Pt A&Ox4, ambulatory.

## 2021-08-20 NOTE — ED Triage Notes (Signed)
Pt has no physical complaints. Pt ambulated with EMS, pt walked into the ambulance with EMS after leaving apartment, walking down the stairs. Pt intoxicated.

## 2021-08-20 NOTE — ED Notes (Signed)
Pt ambulated without assistance with a steady gait, pt able to answer questions and follow commands. Pt does not have slurred speech.

## 2021-08-26 ENCOUNTER — Emergency Department (HOSPITAL_COMMUNITY)
Admission: EM | Admit: 2021-08-26 | Discharge: 2021-08-26 | Discharge status: Against Medical Advice | Payer: Medicaid Other

## 2021-08-26 NOTE — ED Notes (Signed)
Pt NA for triage x4

## 2021-08-26 NOTE — ED Notes (Signed)
PT called multiple on multiple instances for triage with no response.

## 2021-08-29 ENCOUNTER — Emergency Department (HOSPITAL_COMMUNITY)
Admission: EM | Admit: 2021-08-29 | Discharge: 2021-08-29 | Disposition: A | Discharge status: Home / Self Care | Payer: Medicaid Other | Attending: Emergency Medicine | Admitting: Emergency Medicine

## 2021-08-29 DIAGNOSIS — F32A Depression, unspecified: Secondary | ICD-10-CM | POA: Insufficient documentation

## 2021-08-29 DIAGNOSIS — R109 Unspecified abdominal pain: Secondary | ICD-10-CM | POA: Insufficient documentation

## 2021-08-29 DIAGNOSIS — Y908 Blood alcohol level of 240 mg/100 ml or more: Secondary | ICD-10-CM | POA: Insufficient documentation

## 2021-08-29 DIAGNOSIS — F1014 Alcohol abuse with alcohol-induced mood disorder: Secondary | ICD-10-CM | POA: Insufficient documentation

## 2021-08-29 DIAGNOSIS — R45851 Suicidal ideations: Secondary | ICD-10-CM | POA: Insufficient documentation

## 2021-08-29 DIAGNOSIS — F1310 Sedative, hypnotic or anxiolytic abuse, uncomplicated: Secondary | ICD-10-CM | POA: Insufficient documentation

## 2021-08-29 LAB — COMPREHENSIVE METABOLIC PANEL
ALT: 61 U/L — ABNORMAL HIGH (ref 0–44)
AST: 100 U/L — ABNORMAL HIGH (ref 15–41)
Albumin: 4.4 g/dL (ref 3.5–5.0)
Alkaline Phosphatase: 72 U/L (ref 38–126)
Anion gap: 17 — ABNORMAL HIGH (ref 5–15)
BUN: 8 mg/dL (ref 8–23)
CO2: 20 mmol/L — ABNORMAL LOW (ref 22–32)
Calcium: 8.7 mg/dL — ABNORMAL LOW (ref 8.9–10.3)
Chloride: 100 mmol/L (ref 98–111)
Creatinine, Ser: 0.81 mg/dL (ref 0.61–1.24)
GFR, Estimated: >60 mL/min (ref >60)
Glucose, Bld: 78 mg/dL (ref 70–99)
Potassium: 3.9 mmol/L (ref 3.5–5.1)
Sodium: 137 mmol/L (ref 135–145)
Total Bilirubin: 0.6 mg/dL (ref 0.3–1.2)
Total Protein: 6.8 g/dL (ref 6.5–8.1)

## 2021-08-29 LAB — RAPID URINE DRUG SCREEN, HOSP PERFORMED
Amphetamines: NOT DETECTED
Barbiturates: NOT DETECTED
Benzodiazepines: POSITIVE — AB
Cocaine: NOT DETECTED
Opiates: NOT DETECTED
Tetrahydrocannabinol: NOT DETECTED

## 2021-08-29 LAB — CBC
HCT: 42.2 % (ref 39.0–52.0)
Hemoglobin: 14.9 g/dL (ref 13.0–17.0)
MCH: 31.8 pg (ref 26.0–34.0)
MCHC: 35.3 g/dL (ref 30.0–36.0)
MCV: 90 fL (ref 80.0–100.0)
Platelets: 145 10*3/uL — ABNORMAL LOW (ref 150–400)
RBC: 4.69 MIL/uL (ref 4.22–5.81)
RDW: 13.2 % (ref 11.5–15.5)
WBC: 5.3 10*3/uL (ref 4.0–10.5)
nRBC: 0 % (ref 0.0–0.2)

## 2021-08-29 LAB — ETHANOL: Alcohol, Ethyl (B): 249 mg/dL — ABNORMAL HIGH (ref <10)

## 2021-08-29 LAB — URINALYSIS, ROUTINE W REFLEX MICROSCOPIC
Bilirubin Urine: NEGATIVE
Glucose, UA: NEGATIVE mg/dL
Hgb urine dipstick: NEGATIVE
Ketones, ur: NEGATIVE mg/dL
Leukocytes,Ua: NEGATIVE
Nitrite: NEGATIVE
Protein, ur: NEGATIVE mg/dL
Specific Gravity, Urine: 1.003 — ABNORMAL LOW (ref 1.005–1.030)
pH: 6 (ref 5.0–8.0)

## 2021-08-29 LAB — LIPASE, BLOOD: Lipase: 33 U/L (ref 11–51)

## 2021-08-29 MED ORDER — CHLORDIAZEPOXIDE HCL 25 MG PO CAPS: ORAL_CAPSULE | ORAL | 0 refills | Status: DC

## 2021-08-29 NOTE — ED Provider Notes (Signed)
[facility] EMERGENCY DEPARTMENT Provider Note   CSN: [id number] Arrival date & time: 08/29/21  1839     History  Chief Complaint  Patient presents with   Abdominal Pain   Suicidal    Dylan Duke. is a 61 y.o. male history of alcohol abuse here presenting with suicidal ideation and alcohol intoxication.  Patient states that he drinks alcohol every day.  He drinks 1 case of beer a day.  He states that after he drank alcohol he felt depressed.  He also has some abdominal pain but denies any vomiting.  He states that he did call EMS. He states that now that he is more sober, he feels better.  He states that he is not suicidal anymore and does not have any plan to kill himself.  He was seen about a week ago and was prescribed Librium and states that Librium helps him with alcohol withdrawal symptoms but he relapsed again.   The history is provided by the patient.       Home Medications Prior to Admission medications   Medication Sig Start Date End Date Taking? Authorizing Provider  benzonatate (TESSALON) 100 MG capsule Take 1 capsule (100 mg total) by mouth every 8 (eight) hours. 12/10/20   Renne Crigler, PA-C  chlordiazePOXIDE (LIBRIUM) 25 MG capsule 50mg  PO TID x 1D, then 25-50mg  PO BID X 1D, then 25-50mg  PO QD X 1D 08/20/21   Henderly, Britni A, PA-C  predniSONE (DELTASONE) 10 MG tablet Take 6 tabs day one, 5 tabs day two, 4 tabs day three, etc 03/15/20   05/13/20, PA-C      Allergies    Penicillins    Review of Systems   Review of Systems  Constitutional:        Alcohol use  Gastrointestinal:  Positive for abdominal pain.  All other systems reviewed and are negative.   Physical Exam Updated Vital Signs BP 127/88   Pulse 98   Temp 98.2 F (36.8 C) (Oral)   Resp 16   SpO2 93%  Physical Exam Vitals and nursing note reviewed.  Constitutional:      Comments: Slightly intoxicated  HENT:     Head: Normocephalic.     Comments: No  signs of head trauma    Mouth/Throat:     Mouth: Mucous membranes are moist.  Eyes:     Extraocular Movements: Extraocular movements intact.     Pupils: Pupils are equal, round, and reactive to light.  Cardiovascular:     Rate and Rhythm: Normal rate and regular rhythm.     Heart sounds: Normal heart sounds.  Pulmonary:     Effort: Pulmonary effort is normal.     Breath sounds: Normal breath sounds.  Abdominal:     General: Abdomen is flat.  Skin:    General: Skin is warm.     Capillary Refill: Capillary refill takes less than 2 seconds.  Neurological:     Comments: Slightly intoxicated but patient able to ambulate by himself.  Patient also has a steady gait and normal strength bilateral arms and legs.  Psychiatric:     Comments: Patient is slightly depressed but not suicidal     ED Results / Procedures / Treatments   Labs (all labs ordered are listed, but only abnormal results are displayed) Labs Reviewed  COMPREHENSIVE METABOLIC PANEL - Abnormal; Notable for the following components:      Result Value   CO2 20 (*)    Calcium  8.7 (*)    AST 100 (*)    ALT 61 (*)    Anion gap 17 (*)    All other components within normal limits  CBC - Abnormal; Notable for the following components:   Platelets 145 (*)    All other components within normal limits  URINALYSIS, ROUTINE W REFLEX MICROSCOPIC - Abnormal; Notable for the following components:   Color, Urine STRAW (*)    Specific Gravity, Urine 1.003 (*)    All other components within normal limits  RAPID URINE DRUG SCREEN, HOSP PERFORMED - Abnormal; Notable for the following components:   Benzodiazepines POSITIVE (*)    All other components within normal limits  ETHANOL - Abnormal; Notable for the following components:   Alcohol, Ethyl (B) 249 (*)    All other components within normal limits  RESP PANEL BY RT-PCR (FLU A&B, COVID) ARPGX2  LIPASE, BLOOD    EKG None  Radiology No results found.  Procedures Procedures     Medications Ordered in ED Medications - No data to display  ED Course/ Medical Decision Making/ A&P                           Medical Decision Making Dylan Olivera. is a 61 y.o. male History of alcohol abuse here presenting with alcohol intoxication and vague suicidal ideation and also abdominal pain.  Patient has nontender abdominal exam.  Plan to get CBC and CMP and lipase and alcohol level.  Patient states that he is no longer suicidal and does not want to see psych counseling today.   8:32 PM I reviewed patient's labs.  Alcohol level was 250.  CMP showed CO2 of 20 with a gap of 17.  Patient has no vomiting while he is in the ER and states that he just wants to go home now.  He is taking a cab and is clinically sober.  Will prescribe Librium taper and have him follow-up with behavioral health.  I think he likely has alcohol induced mood disorder    Problems Addressed: Alcohol abuse with alcohol-induced mood disorder (HCC): acute illness or injury  Amount and/or Complexity of Data Reviewed Labs: ordered. Decision-making details documented in ED Course.   Final Clinical Impression(s) / ED Diagnoses Final diagnoses:  None    Rx / DC Orders ED Discharge Orders     None         Charlynne Pander, MD 08/29/21 2038

## 2021-08-29 NOTE — ED Notes (Signed)
Pt was cleared for discharge by EDP Yao.   Discharge instructions were discussed with pt. Pt verbalized understanding with no questions at this time. Pt to go home via cab.

## 2021-08-29 NOTE — ED Provider Triage Note (Signed)
Emergency Medicine Provider Triage Evaluation Note  Dylan Duke. , a 61 y.o. male  was evaluated in triage.  Pt complains of SI without specific plan, denies HI. +Etoh, drinks 1 case beer/day, last drank before coming in tonight.  Diffuse abdominal pain.  Review of Systems  Positive: SI, abdominal pain Negative: CP, SHOB  Physical Exam  BP 127/88   Pulse 98   Temp 98.2 F (36.8 C) (Oral)   Resp 16   SpO2 93%  Gen:   Awake, no distress   Resp:  Normal effort  MSK:   Moves extremities without difficulty  Other:    Medical Decision Making  Medically screening exam initiated at 7:10 PM.  Appropriate orders placed.  Dylan Duke. was informed that the remainder of the evaluation will be completed by another provider, this initial triage assessment does not replace that evaluation, and the importance of remaining in the ED until their evaluation is complete.     Jeannie Fend, PA-C 08/29/21 1911

## 2021-08-29 NOTE — ED Notes (Signed)
Discharge instructions placed. Pt provided with belongings, instructed to change. Pt verbalized having all belongings

## 2021-08-29 NOTE — ED Notes (Signed)
EDP Yao at bedside. 

## 2021-08-29 NOTE — ED Triage Notes (Signed)
Patient BIB [facility] with complaint of abdominal pain, vomiting, and vague suicidal ideation of overdosing on his medications but did not do so.   BP 168/102 Hr 110 RR 14

## 2021-08-29 NOTE — Discharge Instructions (Signed)
Stop drinking alcohol.   Take librium as prescribed   See behavioral health for follow up. See resources   Return to ER if you have thoughts of harming yourself or others, hallucinations

## 2021-10-11 ENCOUNTER — Other Ambulatory Visit: Payer: Self-pay

## 2021-10-11 ENCOUNTER — Emergency Department (HOSPITAL_COMMUNITY)
Admission: EM | Admit: 2021-10-11 | Discharge: 2021-10-14 | Disposition: A | Discharge status: Other Acute Inpatient Hospital | Payer: Commercial Managed Care - HMO | Attending: Emergency Medicine | Admitting: Emergency Medicine

## 2021-10-11 ENCOUNTER — Encounter (HOSPITAL_COMMUNITY): Payer: Self-pay | Admitting: Emergency Medicine

## 2021-10-11 DIAGNOSIS — F1010 Alcohol abuse, uncomplicated: Secondary | ICD-10-CM | POA: Diagnosis not present

## 2021-10-11 DIAGNOSIS — Y908 Blood alcohol level of 240 mg/100 ml or more: Secondary | ICD-10-CM | POA: Insufficient documentation

## 2021-10-11 DIAGNOSIS — Z20822 Contact with and (suspected) exposure to covid-19: Secondary | ICD-10-CM | POA: Insufficient documentation

## 2021-10-11 DIAGNOSIS — F332 Major depressive disorder, recurrent severe without psychotic features: Secondary | ICD-10-CM | POA: Diagnosis not present

## 2021-10-11 DIAGNOSIS — R45851 Suicidal ideations: Secondary | ICD-10-CM | POA: Insufficient documentation

## 2021-10-11 DIAGNOSIS — F17200 Nicotine dependence, unspecified, uncomplicated: Secondary | ICD-10-CM | POA: Insufficient documentation

## 2021-10-11 DIAGNOSIS — Z7901 Long term (current) use of anticoagulants: Secondary | ICD-10-CM | POA: Insufficient documentation

## 2021-10-11 LAB — RAPID URINE DRUG SCREEN, HOSP PERFORMED
Amphetamines: NOT DETECTED
Barbiturates: NOT DETECTED
Benzodiazepines: NOT DETECTED
Cocaine: NOT DETECTED
Opiates: NOT DETECTED
Tetrahydrocannabinol: NOT DETECTED

## 2021-10-11 LAB — CBC
HCT: 43.9 % (ref 39.0–52.0)
Hemoglobin: 14.6 g/dL (ref 13.0–17.0)
MCH: 31 pg (ref 26.0–34.0)
MCHC: 33.3 g/dL (ref 30.0–36.0)
MCV: 93.2 fL (ref 80.0–100.0)
Platelets: 193 10*3/uL (ref 150–400)
RBC: 4.71 MIL/uL (ref 4.22–5.81)
RDW: 13.2 % (ref 11.5–15.5)
WBC: 5.2 10*3/uL (ref 4.0–10.5)
nRBC: 0 % (ref 0.0–0.2)

## 2021-10-11 LAB — COMPREHENSIVE METABOLIC PANEL
ALT: 38 U/L (ref 0–44)
AST: 31 U/L (ref 15–41)
Albumin: 4 g/dL (ref 3.5–5.0)
Alkaline Phosphatase: 73 U/L (ref 38–126)
Anion gap: 11 (ref 5–15)
BUN: 11 mg/dL (ref 8–23)
CO2: 24 mmol/L (ref 22–32)
Calcium: 8.1 mg/dL — ABNORMAL LOW (ref 8.9–10.3)
Chloride: 98 mmol/L (ref 98–111)
Creatinine, Ser: 0.92 mg/dL (ref 0.61–1.24)
GFR, Estimated: >60 mL/min (ref >60)
Glucose, Bld: 91 mg/dL (ref 70–99)
Potassium: 4.3 mmol/L (ref 3.5–5.1)
Sodium: 133 mmol/L — ABNORMAL LOW (ref 135–145)
Total Bilirubin: 0.7 mg/dL (ref 0.3–1.2)
Total Protein: 6.6 g/dL (ref 6.5–8.1)

## 2021-10-11 LAB — URINALYSIS, ROUTINE W REFLEX MICROSCOPIC
Bilirubin Urine: NEGATIVE
Glucose, UA: NEGATIVE mg/dL
Hgb urine dipstick: NEGATIVE
Ketones, ur: NEGATIVE mg/dL
Leukocytes,Ua: NEGATIVE
Nitrite: NEGATIVE
Protein, ur: NEGATIVE mg/dL
Specific Gravity, Urine: 1.004 — ABNORMAL LOW (ref 1.005–1.030)
pH: 6 (ref 5.0–8.0)

## 2021-10-11 LAB — ACETAMINOPHEN LEVEL: Acetaminophen (Tylenol), Serum: <10 ug/mL — ABNORMAL LOW (ref 10–30)

## 2021-10-11 LAB — RESP PANEL BY RT-PCR (FLU A&B, COVID) ARPGX2
Influenza A by PCR: NEGATIVE
Influenza B by PCR: NEGATIVE
SARS Coronavirus 2 by RT PCR: NEGATIVE

## 2021-10-11 LAB — SALICYLATE LEVEL: Salicylate Lvl: <7 mg/dL — ABNORMAL LOW (ref 7.0–30.0)

## 2021-10-11 LAB — ETHANOL: Alcohol, Ethyl (B): 294 mg/dL — ABNORMAL HIGH (ref <10)

## 2021-10-11 MED ORDER — LORAZEPAM 2 MG/ML IJ SOLN: 0.0000 mg | Freq: Four times a day (QID) | INTRAMUSCULAR | Status: AC

## 2021-10-11 MED ORDER — ALBUTEROL SULFATE HFA 108 (90 BASE) MCG/ACT IN AERS
1.0000 | INHALATION_SPRAY | Freq: Four times a day (QID) | RESPIRATORY_TRACT | Status: DC
  Administered 2021-10-12 – 2021-10-14 (×8): 2 via RESPIRATORY_TRACT
  Filled 2021-10-11 (×2): qty 6.7

## 2021-10-11 MED ORDER — LORAZEPAM 1 MG PO TABS
0.0000 mg | ORAL_TABLET | Freq: Four times a day (QID) | ORAL | Status: AC
  Administered 2021-10-11 – 2021-10-13 (×6): 1 mg via ORAL
  Filled 2021-10-11 (×6): qty 1

## 2021-10-11 MED ORDER — THIAMINE MONONITRATE 100 MG PO TABS
100.0000 mg | ORAL_TABLET | Freq: Every day | ORAL | Status: DC
  Administered 2021-10-11 – 2021-10-14 (×4): 100 mg via ORAL
  Filled 2021-10-11 (×3): qty 1

## 2021-10-11 MED ORDER — LORAZEPAM 2 MG/ML IJ SOLN
0.0000 mg | Freq: Two times a day (BID) | INTRAMUSCULAR | Status: DC
  Filled 2021-10-11: qty 1

## 2021-10-11 MED ORDER — NICOTINE 21 MG/24HR TD PT24
21.0000 mg | MEDICATED_PATCH | Freq: Once | TRANSDERMAL | Status: AC
  Administered 2021-10-11: 21 mg via TRANSDERMAL
  Filled 2021-10-11: qty 1

## 2021-10-11 MED ORDER — THIAMINE HCL 100 MG/ML IJ SOLN: 100.0000 mg | Freq: Every day | INTRAMUSCULAR | Status: DC

## 2021-10-11 MED ORDER — LORAZEPAM 1 MG PO TABS
0.0000 mg | ORAL_TABLET | Freq: Two times a day (BID) | ORAL | Status: DC
  Filled 2021-10-11 (×2): qty 1
  Filled 2021-10-11: qty 3

## 2021-10-11 NOTE — ED Notes (Signed)
Pt resting with eyes closed, even unlabored breathing, no complaints. Side chair rails up x2, call bell within reach, pt in view of staff.

## 2021-10-11 NOTE — ED Triage Notes (Signed)
BIBA Per EMS: Pt coming from hotel w/ c/o ETOH and wanting detox. Pt is also stating he is suicidal. Hx anxiety  3-4 beers per day. Drank a 12 pack today  95%  120 palp  70 HR  16 RR

## 2021-10-11 NOTE — Consult Note (Addendum)
[facility] ED ASSESSMENT   Reason for Consult:  Psychiatry evaluation Referring Physician:  ER Physician Patient Identification: Dylan Pel. MRN:  [id number] ED Chief Complaint: Major depressive disorder, recurrent episode, severe (HCC)  Diagnosis:  Principal Problem:   Major depressive disorder, recurrent episode, severe (HCC) Active Problems:   Alcohol abuse   ED Assessment Time Calculation: Start Time: 1732 Stop Time: 1756 Total Time in Minutes (Assessment Completion): 24   Subjective:   Dylan Figueira. is a 61 y.o. male patient admitted with hx significant for Depression and Alcohol dependence brought in by EMS from a Hotel with c/o of needing detox.  Patient was discharged from alcohol rehab facility in [place] today and was dropped off at a [facility] in [place].  Patient stated that he resumed drinking immediately.  He drank 12 packs today.  Patient also reported 15-20 rehab treatments in the past.  HPI:  Patient was seen by provider this evening and he reported feeling angry and disappointed he relapsed immediately he left a rehab facility.  Patient currently denies suicide ideation and denies AVH.  He reported hx of Depression and stated that he takes Seroquel and Trazodone for sleep.  Patient denied any illicit drug use and stated that he drinks more of Alcohol.  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  He rated his depression 9/10 with 10 being severe depression.  His trigger for depression is life in General he says.  Patient is angry that he cannot stop drinking Alcohol.  Patient is unemployed at this time and is also divorced.  Alcohol level on arrival was 294 and UDS is Negative.  Again he denies SI/HI/AVH and no mention of paranoia.  Of note patient told the Physician that he was suicidal with plan to hang himself but denied Suicide ideation with this provider.  He also denied AVH but reported seeing people in the room that do not talk to him.  Past  Psychiatric History: hx significant for Depression and Alcohol dependence.  Patient reported four inpatient Psychiatry hospitalization for Depression and twice for OD.  He has had detox and rehab treated 15-20 times and last was last month of which he was discharged today and he relapsed.  Risk to Self or Others: Is the patient at risk to self? No Has the patient been a risk to self in the past 6 months? No Has the patient been a risk to self within the distant past? No Is the patient a risk to others? No Has the patient been a risk to others in the past 6 months? No Has the patient been a risk to others within the distant past? No  [place] Scale:  Flowsheet Row ED from 10/11/2021 in [facility] ED from 08/29/2021 in [facility]GENCY DEPART[facility] ED from 08/20/2021 in [facility]GENC[facility]  C-SSRS RISK CATEGORY Moderate Risk Moderate Risk No Risk       AIMS:  , , ,  ,   ASAM:    Substance Abuse:     Past Medical History: History reviewed. No pertinent past medical history. History reviewed. No pertinent surgical history. Family History: History reviewed. No pertinent family history. Family Psychiatric  History: Parents-Alcoholic, Cousin- Dementia and Schizophrenia. Social History:  Social History   Substance and Sexual Activity  Alcohol Use Not Currently     Social History   Substance and Sexual Activity  Drug Use Never    Social History  Socioeconomic History   Marital status: Divorced    Spouse name: Not on file   Number of children: Not on file   Years of education: Not on file   Highest education level: Not on file  Occupational History   Not on file  Tobacco Use   Smoking status: Some Days   Smokeless tobacco: Never  Vaping Use   Vaping Use: Some days  Substance and Sexual Activity   Alcohol use: Not Currently   Drug use: Never   Sexual activity: Not on file  Other Topics Concern    Not on file  Social History Narrative   ** Merged History Encounter **       Social Determinants of Health   Financial Resource Strain: Not on file  Food Insecurity: Not on file  Transportation Needs: Not on file  Physical Activity: Not on file  Stress: Not on file  Social Connections: Not on file   Additional Social History:    Allergies:   Allergies  Allergen Reactions   Penicillins Rash    Labs:  Results for orders placed or performed during the hospital encounter of 10/11/21 (from the past 48 hour(s))  Resp Panel by RT-PCR (Flu A&B, Covid) Anterior Nasal Swab     Status: None   Collection Time: 10/11/21 11:13 AM   Specimen: Anterior Nasal Swab  Result Value Ref Range   SARS Coronavirus 2 by RT PCR NEGATIVE NEGATIVE    Comment: (NOTE) SARS-CoV-2 target nucleic acids are NOT DETECTED.  The SARS-CoV-2 RNA is generally detectable in upper respiratory specimens during the acute phase of infection. The lowest concentration of SARS-CoV-2 viral copies this assay can detect is 138 copies/mL. A negative result does not preclude SARS-Cov-2 infection and should not be used as the sole basis for treatment or other patient management decisions. A negative result may occur with  improper specimen collection/handling, submission of specimen other than nasopharyngeal swab, presence of viral mutation(s) within the areas targeted by this assay, and inadequate number of viral copies(<138 copies/mL). A negative result must be combined with clinical observations, patient history, and epidemiological information. The expected result is Negative.  Fact Sheet for Patients:  BloggerCourse.comhttps://www.fda.gov/media/152166/download  Fact Sheet for Healthcare Providers:  SeriousBroker.ithttps://www.fda.gov/media/152162/download  This test is no t yet approved or cleared by the [place] FDA and  has been authorized for detection and/or diagnosis of SARS-CoV-2 by FDA under an Emergency Use Authorization (EUA).  This EUA will remain  in effect (meaning this test can be used) for the duration of the COVID-19 declaration under Section 564(b)(1) of the Act, 21 U.S.C.section 360bbb-3(b)(1), unless the authorization is terminated  or revoked sooner.       Influenza A by PCR NEGATIVE NEGATIVE   Influenza B by PCR NEGATIVE NEGATIVE    Comment: (NOTE) The Xpert Xpress SARS-CoV-2/FLU/RSV plus assay is intended as an aid in the diagnosis of influenza from Nasopharyngeal swab specimens and should not be used as a sole basis for treatment. Nasal washings and aspirates are unacceptable for Xpert Xpress SARS-CoV-2/FLU/RSV testing.  Fact Sheet for Patients: BloggerCourse.comhttps://www.fda.gov/media/152166/download  Fact Sheet for Healthcare Providers: SeriousBroker.ithttps://www.fda.gov/media/152162/download  This test is not yet approved or cleared by the [place] FDA and has been authorized for detection and/or diagnosis of SARS-CoV-2 by FDA under an Emergency Use Authorization (EUA). This EUA will remain in effect (meaning this test can be used) for the duration of the COVID-19 declaration under Section 564(b)(1) of the Act, 21 U.S.C. section 360bbb-3(b)(1), unless the authorization  is terminated or revoked.  Performed at [facility], [address] [place], [place] [address]   Ethanol     Status: Abnormal   Collection Time: 10/11/21 11:35 AM  Result Value Ref Range   Alcohol, Ethyl (B) 294 (H) <10 mg/dL    Comment: (NOTE) Lowest detectable limit for serum alcohol is 10 mg/dL.  For medical purposes only. Performed at [facility], [address] [place], [place] [address]   Salicylate level     Status: Abnormal   Collection Time: 10/11/21 11:35 AM  Result Value Ref Range   Salicylate Lvl <7.0 (L) 7.0 - 30.0 mg/dL    Comment: Performed at [facility], [address], [place], [place] [address]  Acetaminophen level     Status: Abnormal   Collection Time:  10/11/21 11:35 AM  Result Value Ref Range   Acetaminophen (Tylenol), Serum <10 (L) 10 - 30 ug/mL    Comment: (NOTE) Therapeutic concentrations vary significantly. A range of 10-30 ug/mL  may be an effective concentration for many patients. However, some  are best treated at concentrations outside of this range. Acetaminophen concentrations >150 ug/mL at 4 hours after ingestion  and >50 ug/mL at 12 hours after ingestion are often associated with  toxic reactions.  Performed at [facility], [address], [place], [place] [address]   Comprehensive metabolic panel     Status: Abnormal   Collection Time: 10/11/21 11:54 AM  Result Value Ref Range   Sodium 133 (L) 135 - 145 mmol/L   Potassium 4.3 3.5 - 5.1 mmol/L   Chloride 98 98 - 111 mmol/L   CO2 24 22 - 32 mmol/L   Glucose, Bld 91 70 - 99 mg/dL    Comment: Glucose reference range applies only to samples taken after fasting for at least 8 hours.   BUN 11 8 - 23 mg/dL   Creatinine, Ser 9.56 0.61 - 1.24 mg/dL   Calcium 8.1 (L) 8.9 - 10.3 mg/dL   Total Protein 6.6 6.5 - 8.1 g/dL   Albumin 4.0 3.5 - 5.0 g/dL   AST 31 15 - 41 U/L   ALT 38 0 - 44 U/L   Alkaline Phosphatase 73 38 - 126 U/L   Total Bilirubin 0.7 0.3 - 1.2 mg/dL   GFR, Estimated >38 >75 mL/min    Comment: (NOTE) Calculated using the CKD-EPI Creatinine Equation (2021)    Anion gap 11 5 - 15    Comment: Performed at [facility], [address], [place], [place] [address]  cbc     Status: None   Collection Time: 10/11/21 11:54 AM  Result Value Ref Range   WBC 5.2 4.0 - 10.5 K/uL   RBC 4.71 4.22 - 5.81 MIL/uL   Hemoglobin 14.6 13.0 - 17.0 g/dL   HCT 95.1 88.4 - 16.6 %   MCV 93.2 80.0 - 100.0 fL   MCH 31.0 26.0 - 34.0 pg   MCHC 33.3 30.0 - 36.0 g/dL   RDW 06.3 01.6 - 01.0 %   Platelets 193 150 - 400 K/uL   nRBC 0.0 0.0 - 0.2 %    Comment: Performed at [facility] [address] [place], [place] [address]   Rapid urine drug screen (hospital performed)     Status: None   Collection Time: 10/11/21 12:37 PM  Result Value Ref Range   Opiates NONE DETECTED NONE DETECTED   Cocaine NONE DETECTED NONE DETECTED  Benzodiazepines NONE DETECTED NONE DETECTED   Amphetamines NONE DETECTED NONE DETECTED   Tetrahydrocannabinol NONE DETECTED NONE DETECTED   Barbiturates NONE DETECTED NONE DETECTED    Comment: (NOTE) DRUG SCREEN FOR MEDICAL PURPOSES ONLY.  IF CONFIRMATION IS NEEDED FOR ANY PURPOSE, NOTIFY LAB WITHIN 5 DAYS.  LOWEST DETECTABLE LIMITS FOR URINE DRUG SCREEN Drug Class                     Cutoff (ng/mL) Amphetamine and metabolites    1000 Barbiturate and metabolites    200 Benzodiazepine                 200 Tricyclics and metabolites     300 Opiates and metabolites        300 Cocaine and metabolites        300 THC                            50 Performed at [facility], [address] [place], [place] [address]   Urinalysis, Routine w reflex microscopic Urine, Clean Catch     Status: Abnormal   Collection Time: 10/11/21  4:44 PM  Result Value Ref Range   Color, Urine COLORLESS (A) YELLOW   APPearance CLEAR CLEAR   Specific Gravity, Urine 1.004 (L) 1.005 - 1.030   pH 6.0 5.0 - 8.0   Glucose, UA NEGATIVE NEGATIVE mg/dL   Hgb urine dipstick NEGATIVE NEGATIVE   Bilirubin Urine NEGATIVE NEGATIVE   Ketones, ur NEGATIVE NEGATIVE mg/dL   Protein, ur NEGATIVE NEGATIVE mg/dL   Nitrite NEGATIVE NEGATIVE   Leukocytes,Ua NEGATIVE NEGATIVE    Comment: Performed at [facility], [address] [place], [place] [address]    Current Facility-Administered Medications  Medication Dose Route Frequency Provider Last Rate Last Admin   LORazepam (ATIVAN) injection 0-4 mg  0-4 mg Intravenous Q6H Redwine, Madison A, PA-C       Or   LORazepam (ATIVAN) tablet 0-4 mg  0-4 mg Oral Q6H Redwine, Madison A, PA-C   1 mg at 10/11/21 1756   [START ON 10/13/2021]  LORazepam (ATIVAN) injection 0-4 mg  0-4 mg Intravenous Q12H Redwine, Madison A, PA-C       Or   [START ON 10/13/2021] LORazepam (ATIVAN) tablet 0-4 mg  0-4 mg Oral Q12H Redwine, Madison A, PA-C       thiamine (VITAMIN B1) tablet 100 mg  100 mg Oral Daily Redwine, Madison A, PA-C   100 mg at 10/11/21 1216   Or   thiamine (VITAMIN B1) injection 100 mg  100 mg Intravenous Daily Redwine, Madison A, PA-C       Current Outpatient Medications  Medication Sig Dispense Refill   albuterol (VENTOLIN HFA) 108 (90 Base) MCG/ACT inhaler Inhale 2 puffs into the lungs 2 (two) times daily as needed.     clonazePAM (KLONOPIN) 1 MG tablet Take 1 mg by mouth in the morning and at bedtime.     cyclobenzaprine (FLEXERIL) 10 MG tablet Take 10 mg by mouth daily.     ELIQUIS 5 MG TABS tablet Take 5 mg by mouth 2 (two) times daily.     folic acid (FOLVITE) 1 MG tablet Take 1 mg by mouth daily.     gabapentin (NEURONTIN) 300 MG capsule Take 300 mg by mouth 3 (three) times daily.     ibuprofen (ADVIL) 400 MG tablet Take 400 mg by mouth every 6 (  six) hours as needed.     lactulose (CHRONULAC) 10 GM/15ML solution Take 20 g by mouth in the morning and at bedtime.     Multiple Vitamin (DAILY VITES) tablet Take 1 tablet by mouth daily.     QUEtiapine (SEROQUEL) 100 MG tablet Take 100 mg by mouth at bedtime.     sertraline (ZOLOFT) 25 MG tablet Take 25 mg by mouth daily.     thiamine (VITAMIN B1) 100 MG tablet Take 100 mg by mouth daily.     Tiotropium Bromide Monohydrate 2.5 MCG/ACT AERS Inhale 2 puffs into the lungs daily.     traZODone (DESYREL) 100 MG tablet Take 100 mg by mouth at bedtime.     benzonatate (TESSALON) 100 MG capsule Take 1 capsule (100 mg total) by mouth every 8 (eight) hours. (Patient not taking: Reported on 10/11/2021) 15 capsule 0   chlordiazePOXIDE (LIBRIUM) 25 MG capsule 50mg  PO TID x 1D, then 25-50mg  PO BID X 1D, then 25-50mg  PO QD X 1D (Patient not taking: Reported on 10/11/2021) 10 capsule 0    predniSONE (DELTASONE) 10 MG tablet Take 6 tabs day one, 5 tabs day two, 4 tabs day three, etc (Patient not taking: Reported on 10/11/2021) 21 tablet 0    Musculoskeletal: Strength & Muscle Tone: within normal limits Gait & Station: normal Patient leans: Front   Psychiatric Specialty Exam: Presentation  General Appearance: Disheveled  Eye Contact:Good  Speech:Clear and Coherent; Normal Rate  Speech Volume:Normal  Handedness:Right   Mood and Affect  Mood:Depressed; Anxious; Angry  Affect:Depressed; Congruent   Thought Process  Thought Processes:Coherent; Goal Directed; Linear  Descriptions of Associations:Intact  Orientation:Full (Time, Place and Person)  Thought Content:Logical  History of Schizophrenia/Schizoaffective disorder:No data recorded Duration of Psychotic Symptoms:No data recorded Hallucinations:Hallucinations: None  Ideas of Reference:None  Suicidal Thoughts:Suicidal Thoughts: No  Homicidal Thoughts:Homicidal Thoughts: No   Sensorium  Memory:Immediate Good; Recent Good; Remote Fair  Judgment:Fair  Insight:Fair   Executive Functions  Concentration:Good  Attention Span:Good  Recall:Good  Fund of Knowledge:Good  Language:Good   Psychomotor Activity  Psychomotor Activity:Psychomotor Activity: Normal   Assets  Assets:Communication Skills; Social Support    Sleep  Sleep:Sleep: Good   Physical Exam: Physical Exam Vitals and nursing note reviewed.  Constitutional:      Appearance: Normal appearance.  HENT:     Head: Normocephalic.     Nose: Nose normal.  Cardiovascular:     Rate and Rhythm: Normal rate.  Pulmonary:     Effort: Pulmonary effort is normal.  Musculoskeletal:        General: Normal range of motion.     Cervical back: Normal range of motion.  Skin:    General: Skin is warm and dry.  Neurological:     Mental Status: He is alert and oriented to person, place, and time.    Review of Systems   Constitutional: Negative.   HENT: Negative.    Eyes: Negative.   Respiratory: Negative.    Cardiovascular: Negative.   Gastrointestinal: Negative.   Genitourinary: Negative.   Musculoskeletal: Negative.   Skin: Negative.   Neurological: Negative.   Endo/Heme/Allergies: Negative.   Psychiatric/Behavioral:  Positive for depression and substance abuse. The patient has insomnia.    Blood pressure 114/66, pulse 68, SpO2 94 %. There is no height or weight on file to calculate BMI.  Medical Decision Making: Patient denied suicide ideation with this provider but earlier stated he was suicidal with plan to hand himself.  He reported two suicide  attempts by OD in the past.  We will monitor patient overnight and reevaluate in am for appropriate disposition. Patient is placed on CIWA protocol with Ativan coverage. Problem 1: Recurrent Major Depressive disorder, moderate -severe without Psychotic features. Problem 2: Alcohol use disorder, severe use.  Disposition:  Monitor overnight and re-evaluate in am.  Earney Navy, NP-PMHNP-BC 10/11/2021 6:01 PM

## 2021-10-11 NOTE — ED Provider Notes (Signed)
[facility]-EMERGENCY DEPT Provider Note   CSN: [id number] Arrival date & time: 10/11/21  1058     History  Chief Complaint  Patient presents with   Alcohol Intoxication   Suicidal   Detox    Dylan Duke. is a 61 y.o. male with a past medical history of alcohol abuse presenting today complaining of SI.  Endorses a plan of hanging himself.  Does state that he drinks two 12 12 packs of beer daily.  Reports he has had 1 this morning.  Denies any drug use.  Does say that he hallucinates and sees people sitting around the room that are not there.  They do not talk to him.  No auditory hallucinations.  No homicidal ideations.  Has a history of depression, no longer takes medication   Alcohol Intoxication       Home Medications Prior to Admission medications   Medication Sig Start Date End Date Taking? Authorizing Provider  albuterol (VENTOLIN HFA) 108 (90 Base) MCG/ACT inhaler Inhale 2 puffs into the lungs 2 (two) times daily as needed. 09/16/21  Yes [provider]  clonazePAM (KLONOPIN) 1 MG tablet Take 1 mg by mouth in the morning and at bedtime.   Yes [provider]  cyclobenzaprine (FLEXERIL) 10 MG tablet Take 10 mg by mouth daily.   Yes [provider]  ELIQUIS 5 MG TABS tablet Take 5 mg by mouth 2 (two) times daily. 10/05/21  Yes [provider]  folic acid (FOLVITE) 1 MG tablet Take 1 mg by mouth daily. 09/09/21  Yes [provider]  gabapentin (NEURONTIN) 300 MG capsule Take 300 mg by mouth 3 (three) times daily. 09/29/21  Yes [provider]  ibuprofen (ADVIL) 400 MG tablet Take 400 mg by mouth every 6 (six) hours as needed.   Yes [provider]  lactulose (CHRONULAC) 10 GM/15ML solution Take 20 g by mouth in the morning and at bedtime. 09/28/21  Yes [provider]  Multiple Vitamin (DAILY VITES) tablet Take 1 tablet by mouth daily. 10/09/21  Yes [provider]  QUEtiapine  (SEROQUEL) 100 MG tablet Take 100 mg by mouth at bedtime. 10/06/21  Yes [provider]  sertraline (ZOLOFT) 25 MG tablet Take 25 mg by mouth daily. 09/22/21  Yes [provider]  thiamine (VITAMIN B1) 100 MG tablet Take 100 mg by mouth daily. 09/09/21  Yes [provider]  Tiotropium Bromide Monohydrate 2.5 MCG/ACT AERS Inhale 2 puffs into the lungs daily. 03/26/20  Yes [provider]  traZODone (DESYREL) 100 MG tablet Take 100 mg by mouth at bedtime. 09/29/21  Yes [provider]  benzonatate (TESSALON) 100 MG capsule Take 1 capsule (100 mg total) by mouth every 8 (eight) hours. Patient not taking: Reported on 10/11/2021 12/10/20   Renne Crigler, PA-C  chlordiazePOXIDE (LIBRIUM) 25 MG capsule 50mg  PO TID x 1D, then 25-50mg  PO BID X 1D, then 25-50mg  PO QD X 1D Patient not taking: Reported on 10/11/2021 08/29/21   08/31/21, MD  predniSONE (DELTASONE) 10 MG tablet Take 6 tabs day one, 5 tabs day two, 4 tabs day three, etc Patient not taking: Reported on 10/11/2021 03/15/20   05/13/20, PA-C      Allergies    Penicillins    Review of Systems   Review of Systems  Physical Exam Updated Vital Signs BP (!) 106/53   Pulse 65   SpO2 94%  Physical Exam Vitals and nursing note reviewed.  Constitutional:      General: He is not in acute distress.    Appearance: Normal appearance. He is not ill-appearing.  HENT:     Head: Normocephalic and atraumatic.  Eyes:     General: No scleral icterus.    Conjunctiva/sclera: Conjunctivae normal.  Cardiovascular:     Rate and Rhythm: Normal rate and regular rhythm.  Pulmonary:     Effort: Pulmonary effort is normal. No respiratory distress.  Skin:    Findings: No rash.  Neurological:     Mental Status: He is alert.     Comments: Acutely intoxicated but alert and oriented x3.  Slurring his speech.  Psychiatric:        Mood and Affect: Mood normal.     ED Results / Procedures / Treatments    Labs (all labs ordered are listed, but only abnormal results are displayed) Labs Reviewed  COMPREHENSIVE METABOLIC PANEL - Abnormal; Notable for the following components:      Result Value   Sodium 133 (*)    Calcium 8.1 (*)    All other components within normal limits  ETHANOL - Abnormal; Notable for the following components:   Alcohol, Ethyl (B) 294 (*)    All other components within normal limits  SALICYLATE LEVEL - Abnormal; Notable for the following components:   Salicylate Lvl <7.0 (*)    All other components within normal limits  ACETAMINOPHEN LEVEL - Abnormal; Notable for the following components:   Acetaminophen (Tylenol), Serum <10 (*)    All other components within normal limits  RESP PANEL BY RT-PCR (FLU A&B, COVID) ARPGX2  CBC  RAPID URINE DRUG SCREEN, HOSP PERFORMED  URINALYSIS, ROUTINE W REFLEX MICROSCOPIC  RAPID URINE DRUG SCREEN, HOSP PERFORMED    EKG None  Radiology No results found.  Procedures Procedures   Medications Ordered in ED Medications  LORazepam (ATIVAN) injection 0-4 mg ( Intravenous See Alternative 10/11/21 1215)    Or  LORazepam (ATIVAN) tablet 0-4 mg (1 mg Oral Given 10/11/21 1215)  LORazepam (ATIVAN) injection 0-4 mg (has no administration in time range)    Or  LORazepam (ATIVAN) tablet 0-4 mg (has no administration in time range)  thiamine (VITAMIN B1) tablet 100 mg (100 mg Oral Given 10/11/21 1216)    Or  thiamine (VITAMIN B1) injection 100 mg ( Intravenous See Alternative 10/11/21 1216)    ED Course/ Medical Decision Making/ A&P                           Medical Decision Making Amount and/or Complexity of Data Reviewed Labs: ordered.  Risk OTC drugs. Prescription drug management.   61 year old male presenting today with SI.  Says that he is "very tired of his life."  Says that he would hang himself in an attempt for suicide.  Slurring of speech on physical exam however otherwise stable.  No signs of withdrawal from the 2 12  packs of alcohol that he drinks daily.  Ethanol was around 300 on arrival.  It has been 4 hours since then and patient is clinically sober and stable for TTS.  UDS negative.  [place] cleared.  We will keep him on CIWA precautions throughout his stay.  Final Clinical Impression(s) / ED Diagnoses Final diagnoses:  Suicidal ideation    Rx / DC Orders Dispo per TTS     Saddie Benders, PA-C 10/11/21 1516    Arby Barrette, MD 10/11/21 1617

## 2021-10-11 NOTE — ED Notes (Signed)
Lunch tray given to pt. Able to eat w/o difficulty.

## 2021-10-12 MED ORDER — LACTULOSE 10 GM/15ML PO SOLN: 20.0000 g | Freq: Every day | ORAL | Status: DC | PRN

## 2021-10-12 MED ORDER — TIOTROPIUM BROMIDE MONOHYDRATE 2.5 MCG/ACT IN AERS: 2.0000 | INHALATION_SPRAY | Freq: Every day | RESPIRATORY_TRACT | Status: DC

## 2021-10-12 MED ORDER — THIAMINE MONONITRATE 100 MG PO TABS: 100.0000 mg | ORAL_TABLET | Freq: Every day | ORAL | Status: DC

## 2021-10-12 MED ORDER — GABAPENTIN 300 MG PO CAPS
300.0000 mg | ORAL_CAPSULE | Freq: Three times a day (TID) | ORAL | Status: DC
  Administered 2021-10-12 – 2021-10-14 (×7): 300 mg via ORAL
  Filled 2021-10-12 (×7): qty 1

## 2021-10-12 MED ORDER — ALBUTEROL SULFATE HFA 108 (90 BASE) MCG/ACT IN AERS: 2.0000 | INHALATION_SPRAY | Freq: Two times a day (BID) | RESPIRATORY_TRACT | Status: DC | PRN

## 2021-10-12 MED ORDER — UMECLIDINIUM BROMIDE 62.5 MCG/ACT IN AEPB
1.0000 | INHALATION_SPRAY | Freq: Every day | RESPIRATORY_TRACT | Status: DC
Start: 2021-10-12 — End: 2021-10-14
  Administered 2021-10-13 – 2021-10-14 (×2): 1 via RESPIRATORY_TRACT
  Filled 2021-10-12: qty 7

## 2021-10-12 MED ORDER — TRAZODONE HCL 100 MG PO TABS
100.0000 mg | ORAL_TABLET | Freq: Every day | ORAL | Status: DC
  Administered 2021-10-12 – 2021-10-13 (×2): 100 mg via ORAL
  Filled 2021-10-12 (×2): qty 1

## 2021-10-12 MED ORDER — ACETAMINOPHEN 325 MG PO TABS: 650.0000 mg | ORAL_TABLET | ORAL | Status: DC | PRN

## 2021-10-12 MED ORDER — FOLIC ACID 1 MG PO TABS
1.0000 mg | ORAL_TABLET | Freq: Every day | ORAL | Status: DC
  Administered 2021-10-12 – 2021-10-14 (×3): 1 mg via ORAL
  Filled 2021-10-12 (×2): qty 1

## 2021-10-12 MED ORDER — NICOTINE 21 MG/24HR TD PT24
21.0000 mg | MEDICATED_PATCH | Freq: Once | TRANSDERMAL | Status: AC
  Administered 2021-10-12 – 2021-10-13 (×2): 21 mg via TRANSDERMAL
  Filled 2021-10-12: qty 1

## 2021-10-12 MED ORDER — ASPIRIN 325 MG PO TABS
325.0000 mg | ORAL_TABLET | Freq: Once | ORAL | Status: AC
  Administered 2021-10-12: 325 mg via ORAL
  Filled 2021-10-12: qty 1

## 2021-10-12 MED ORDER — APIXABAN 5 MG PO TABS
5.0000 mg | ORAL_TABLET | Freq: Two times a day (BID) | ORAL | Status: DC
  Administered 2021-10-12 – 2021-10-14 (×5): 5 mg via ORAL
  Filled 2021-10-12 (×5): qty 1

## 2021-10-12 MED ORDER — QUETIAPINE FUMARATE 100 MG PO TABS
100.0000 mg | ORAL_TABLET | Freq: Every day | ORAL | Status: DC
  Administered 2021-10-12 – 2021-10-13 (×2): 100 mg via ORAL
  Filled 2021-10-12 (×2): qty 1

## 2021-10-12 MED ORDER — ADULT MULTIVITAMIN W/MINERALS CH
1.0000 | ORAL_TABLET | Freq: Every day | ORAL | Status: DC
Start: 2021-10-12 — End: 2021-10-14
  Administered 2021-10-12 – 2021-10-13 (×2): 1 via ORAL
  Filled 2021-10-12 (×3): qty 1

## 2021-10-12 MED ORDER — SERTRALINE HCL 50 MG PO TABS
25.0000 mg | ORAL_TABLET | Freq: Every day | ORAL | Status: DC
  Administered 2021-10-12 – 2021-10-14 (×3): 25 mg via ORAL
  Filled 2021-10-12 (×3): qty 1

## 2021-10-12 NOTE — ED Notes (Signed)
Dinner tray provided

## 2021-10-12 NOTE — ED Provider Notes (Signed)
Emergency Medicine Observation Re-evaluation Note  Meiko Stranahan. is a 61 y.o. male, seen on rounds today.  Pt initially presented to the ED for complaints of Alcohol Intoxication, Suicidal, and Detox Currently, the patient is awake and alert.  Pt is not showing any signs of etoh withdrawal.  He is requesting to be put back on his home meds and requests a nicotine patch.  Physical Exam  BP 127/76 (BP Location: Right Arm)   Pulse 100   Temp 98.2 F (36.8 C) (Oral)   Resp 20   SpO2 90%  Physical Exam General: awake and alert Cardiac: rr Lungs: clear Psych: calm  ED Course / MDM  EKG:   I have reviewed the labs performed to date as well as medications administered while in observation.  Recent changes in the last 24 hours include psych eval last night recommended overnight obs.  I have ordered home meds.  Plan  Current plan is for re-assessment this am.    Jacalyn Lefevre, MD 10/12/21 [phone]

## 2021-10-12 NOTE — Progress Notes (Signed)
Inpatient Behavioral Health Placement  Pt meets inpatient criteria per Earney Navy, NP.  There are no available beds at [facility]. Referral was sent to the following facilities;    Destination Service Provider Address Phone Fax  [facility]  [address] [place] [address] [phone]  [facility] [address] [place]  [address] [place], [place] [address] [phone]  [facility]  [address] [place], [place] [address] [phone]  [facility]  [address] [place] [address] [phone]  [facility]  [address] [place], [place] [address] [phone]  [facility]  [address] [place] [address] [phone]  [facility]  [address] [place] [address] [phone]  [facility]  [address], [place] [address] [phone]  [facility]  [address] [place] [address] [phone]  [facility]  [address] [place] [address] [phone]  [facility]  [address] [place] [address] [phone]  [facility]  [address], [place] [address] [phone]  [facility]  [address] [place] [address] [phone]   Situation ongoing,  CSW will follow up.   Maryjean Ka, MSW, LCSWA 10/12/2021  @ 5:37 PM

## 2021-10-12 NOTE — Progress Notes (Signed)
[facility] Psych ED Progress Note  10/12/2021 2:40 PM Dylan Duke.  MRN:  [id number]   Subjective:  Dylan Duke. is a 61 y.o. male patient admitted with hx significant for Depression and Alcohol dependence brought in by EMS from a Hotel with c/o of needing detox.  Patient was discharged from alcohol rehab facility in [place] today and was dropped off at a [facility] in [place].  Patient stated that he resumed drinking immediately.  He drank 12 packs today.  Patient also reported 15-20 rehab treatments in the past.  Patient was seen this morning alert and oriented x 5.  He reported feeling suicidal and plans to use any means to kill himself.  He is angry and feels worthless due to his relapse yesterday within an hour he was dropped off at the hotel.  Patient is disappointed that despite getting rehab treatment he keeps going back to drinking.  He is homeless and believes that his homelessness makes him drink too much Alcohol.  We will seek inpatient hospitalization while we continue treating in the ER. Principal Problem: Major depressive disorder, recurrent episode, severe (HCC) Diagnosis:  Principal Problem:   Major depressive disorder, recurrent episode, severe (HCC) Active Problems:   Alcohol abuse   ED Assessment Time Calculation: Start Time: 1401 Stop Time: 1420 Total Time in Minutes (Assessment Completion): 19   Past Psychiatric History: See initial Psychiatric evaluation.  [place] Scale:  [facility] from 10/11/2021 in [place] [facility] ED from 08/29/2021 in [facility] from 08/20/2021 in [place] DEPT  C-SSRS RISK CATEGORY Moderate Risk Moderate Risk No Risk       Past Medical History: History reviewed. No pertinent past medical history. History reviewed. No pertinent surgical history. Family History: History reviewed. No pertinent family history. Family Psychiatric   History: see initial psych evaluation note Social History:  Social History   Substance and Sexual Activity  Alcohol Use Not Currently     Social History   Substance and Sexual Activity  Drug Use Never    Social History   Socioeconomic History   Marital status: Divorced    Spouse name: Not on file   Number of children: Not on file   Years of education: Not on file   Highest education level: Not on file  Occupational History   Not on file  Tobacco Use   Smoking status: Some Days   Smokeless tobacco: Never  Vaping Use   Vaping Use: Some days  Substance and Sexual Activity   Alcohol use: Not Currently   Drug use: Never   Sexual activity: Not on file  Other Topics Concern   Not on file  Social History Narrative   ** Merged History Encounter **       Social Determinants of Health   Financial Resource Strain: Not on file  Food Insecurity: Not on file  Transportation Needs: Not on file  Physical Activity: Not on file  Stress: Not on file  Social Connections: Not on file    Sleep: Good  Appetite:  Fair  Current Medications: Current Facility-Administered Medications  Medication Dose Route Frequency Provider Last Rate Last Admin   acetaminophen (TYLENOL) tablet 650 mg  650 mg Oral Q4H PRN Isla Pence, MD       albuterol (VENTOLIN HFA) 108 (90 Base) MCG/ACT inhaler 1-2 puff  1-2 puff Inhalation Q6H Quintella Reichert, MD   2 puff at 10/12/21 1316   albuterol (VENTOLIN HFA)  108 (90 Base) MCG/ACT inhaler 2 puff  2 puff Inhalation BID PRN Isla Pence, MD       apixaban Arne [place]) tablet 5 mg  5 mg Oral BID Isla Pence, MD   5 mg at [id number]   folic acid (FOLVITE) tablet 1 mg  1 mg Oral Daily Isla Pence, MD   1 mg at 10/12/21 0901   gabapentin (NEURONTIN) capsule 300 mg  300 mg Oral TID Isla Pence, MD   300 mg at 10/12/21 0902   lactulose (CHRONULAC) 10 GM/15ML solution 20 g  20 g Oral Daily PRN Isla Pence, MD       LORazepam (ATIVAN) injection  0-4 mg  0-4 mg Intravenous Q6H Redwine, Madison A, PA-C       Or   LORazepam (ATIVAN) tablet 0-4 mg  0-4 mg Oral Q6H Redwine, Madison A, PA-C   1 mg at 10/12/21 0907   [START ON 10/13/2021] LORazepam (ATIVAN) injection 0-4 mg  0-4 mg Intravenous Q12H Redwine, Madison A, PA-C       Or   [START ON 10/13/2021] LORazepam (ATIVAN) tablet 0-4 mg  0-4 mg Oral Q12H Redwine, Madison A, PA-C       multivitamin with minerals tablet 1 tablet  1 tablet Oral Daily Isla Pence, MD   1 tablet at 10/12/21 [id number]   nicotine (NICODERM CQ - dosed in mg/24 hours) patch 21 mg  21 mg Transdermal Once Lacretia Leigh, MD   21 mg at 10/11/21 2239   nicotine (NICODERM CQ - dosed in mg/24 hours) patch 21 mg  21 mg Transdermal Once Isla Pence, MD   21 mg at 10/12/21 1058   QUEtiapine (SEROQUEL) tablet 100 mg  100 mg Oral QHS Isla Pence, MD       sertraline (ZOLOFT) tablet 25 mg  25 mg Oral Daily Isla Pence, MD   25 mg at 10/12/21 [id number]   thiamine (VITAMIN B1) tablet 100 mg  100 mg Oral Daily Redwine, Madison A, PA-C   100 mg at 10/12/21 [id number]   Or   thiamine (VITAMIN B1) injection 100 mg  100 mg Intravenous Daily Redwine, Madison A, PA-C       traZODone (DESYREL) tablet 100 mg  100 mg Oral QHS Isla Pence, MD       umeclidinium bromide (INCRUSE ELLIPTA) 62.5 MCG/ACT 1 puff  1 puff Inhalation Daily Lenis Noon, [facility]       Current Outpatient Medications  Medication Sig Dispense Refill   albuterol (VENTOLIN HFA) 108 (90 Base) MCG/ACT inhaler Inhale 2 puffs into the lungs 2 (two) times daily as needed.     clonazePAM (KLONOPIN) 1 MG tablet Take 1 mg by mouth in the morning and at bedtime.     cyclobenzaprine (FLEXERIL) 10 MG tablet Take 10 mg by mouth daily.     ELIQUIS 5 MG TABS tablet Take 5 mg by mouth 2 (two) times daily.     folic acid (FOLVITE) 1 MG tablet Take 1 mg by mouth daily.     gabapentin (NEURONTIN) 300 MG capsule Take 300 mg by mouth 3 (three) times daily.     ibuprofen (ADVIL) 400 MG tablet  Take 400 mg by mouth every 6 (six) hours as needed.     lactulose (CHRONULAC) 10 GM/15ML solution Take 20 g by mouth in the morning and at bedtime.     Multiple Vitamin (DAILY VITES) tablet Take 1 tablet by mouth daily.     QUEtiapine (SEROQUEL) 100 MG tablet Take  100 mg by mouth at bedtime.     sertraline (ZOLOFT) 25 MG tablet Take 25 mg by mouth daily.     thiamine (VITAMIN B1) 100 MG tablet Take 100 mg by mouth daily.     Tiotropium Bromide Monohydrate 2.5 MCG/ACT AERS Inhale 2 puffs into the lungs daily.     traZODone (DESYREL) 100 MG tablet Take 100 mg by mouth at bedtime.     benzonatate (TESSALON) 100 MG capsule Take 1 capsule (100 mg total) by mouth every 8 (eight) hours. (Patient not taking: Reported on 10/11/2021) 15 capsule 0   chlordiazePOXIDE (LIBRIUM) 25 MG capsule 50mg  PO TID x 1D, then 25-50mg  PO BID X 1D, then 25-50mg  PO QD X 1D (Patient not taking: Reported on 10/11/2021) 10 capsule 0   predniSONE (DELTASONE) 10 MG tablet Take 6 tabs day one, 5 tabs day two, 4 tabs day three, etc (Patient not taking: Reported on 10/11/2021) 21 tablet 0    Lab Results:  Results for orders placed or performed during the hospital encounter of 10/11/21 (from the past 48 hour(s))  Resp Panel by RT-PCR (Flu A&B, Covid) Anterior Nasal Swab     Status: None   Collection Time: 10/11/21 11:13 AM   Specimen: Anterior Nasal Swab  Result Value Ref Range   SARS Coronavirus 2 by RT PCR NEGATIVE NEGATIVE    Comment: (NOTE) SARS-CoV-2 target nucleic acids are NOT DETECTED.  The SARS-CoV-2 RNA is generally detectable in upper respiratory specimens during the acute phase of infection. The lowest concentration of SARS-CoV-2 viral copies this assay can detect is 138 copies/mL. A negative result does not preclude SARS-Cov-2 infection and should not be used as the sole basis for treatment or other patient management decisions. A negative result may occur with  improper specimen collection/handling, submission of  specimen other than nasopharyngeal swab, presence of viral mutation(s) within the areas targeted by this assay, and inadequate number of viral copies(<138 copies/mL). A negative result must be combined with clinical observations, patient history, and epidemiological information. The expected result is Negative.  Fact Sheet for Patients:  EntrepreneurPulse.com.au  Fact Sheet for Healthcare Providers:  IncredibleEmployment.be  This test is no t yet approved or cleared by the [place] FDA and  has been authorized for detection and/or diagnosis of SARS-CoV-2 by FDA under an Emergency Use Authorization (EUA). This EUA will remain  in effect (meaning this test can be used) for the duration of the COVID-19 declaration under Section 564(b)(1) of the Act, 21 U.S.C.section 360bbb-3(b)(1), unless the authorization is terminated  or revoked sooner.       Influenza A by PCR NEGATIVE NEGATIVE   Influenza B by PCR NEGATIVE NEGATIVE    Comment: (NOTE) The Xpert Xpress SARS-CoV-2/FLU/RSV plus assay is intended as an aid in the diagnosis of influenza from Nasopharyngeal swab specimens and should not be used as a sole basis for treatment. Nasal washings and aspirates are unacceptable for Xpert Xpress SARS-CoV-2/FLU/RSV testing.  Fact Sheet for Patients: EntrepreneurPulse.com.au  Fact Sheet for Healthcare Providers: IncredibleEmployment.be  This test is not yet approved or cleared by the [place] FDA and has been authorized for detection and/or diagnosis of SARS-CoV-2 by FDA under an Emergency Use Authorization (EUA). This EUA will remain in effect (meaning this test can be used) for the duration of the COVID-19 declaration under Section 564(b)(1) of the Act, 21 U.S.C. section 360bbb-3(b)(1), unless the authorization is terminated or revoked.  Performed at [facility], [address]  [place], [place] [address]   Ethanol     Status: Abnormal   Collection Time: 10/11/21 11:35 AM  Result Value Ref Range   Alcohol, Ethyl (B) 294 (H) <10 mg/dL    Comment: (NOTE) Lowest detectable limit for serum alcohol is 10 mg/dL.  For medical purposes only. Performed at [facility], [address] [place], [place] [address]   Salicylate level     Status: Abnormal   Collection Time: 10/11/21 11:35 AM  Result Value Ref Range   Salicylate Lvl <7.0 (L) 7.0 - 30.0 mg/dL    Comment: Performed at [facility], [address], [place], [place] [address]  Acetaminophen level     Status: Abnormal   Collection Time: 10/11/21 11:35 AM  Result Value Ref Range   Acetaminophen (Tylenol), Serum <10 (L) 10 - 30 ug/mL    Comment: (NOTE) Therapeutic concentrations vary significantly. A range of 10-30 ug/mL  may be an effective concentration for many patients. However, some  are best treated at concentrations outside of this range. Acetaminophen concentrations >150 ug/mL at 4 hours after ingestion  and >50 ug/mL at 12 hours after ingestion are often associated with  toxic reactions.  Performed at [facility], [address], [place], [place] [address]   Comprehensive metabolic panel     Status: Abnormal   Collection Time: 10/11/21 11:54 AM  Result Value Ref Range   Sodium 133 (L) 135 - 145 mmol/L   Potassium 4.3 3.5 - 5.1 mmol/L   Chloride 98 98 - 111 mmol/L   CO2 24 22 - 32 mmol/L   Glucose, Bld 91 70 - 99 mg/dL    Comment: Glucose reference range applies only to samples taken after fasting for at least 8 hours.   BUN 11 8 - 23 mg/dL   Creatinine, Ser 8.46 0.61 - 1.24 mg/dL   Calcium 8.1 (L) 8.9 - 10.3 mg/dL   Total Protein 6.6 6.5 - 8.1 g/dL   Albumin 4.0 3.5 - 5.0 g/dL   AST 31 15 - 41 U/L   ALT 38 0 - 44 U/L   Alkaline Phosphatase 73 38 - 126 U/L   Total Bilirubin 0.7 0.3 - 1.2 mg/dL   GFR, Estimated >96 >29 mL/min     Comment: (NOTE) Calculated using the CKD-EPI Creatinine Equation (2021)    Anion gap 11 5 - 15    Comment: Performed at [facility], [address], [place], [place] [address]  cbc     Status: None   Collection Time: 10/11/21 11:54 AM  Result Value Ref Range   WBC 5.2 4.0 - 10.5 K/uL   RBC 4.71 4.22 - 5.81 MIL/uL   Hemoglobin 14.6 13.0 - 17.0 g/dL   HCT 32.4 40.1 - 02.7 %   MCV 93.2 80.0 - 100.0 fL   MCH 31.0 26.0 - 34.0 pg   MCHC 33.3 30.0 - 36.0 g/dL   RDW 25.3 66.4 - 40.3 %   Platelets 193 150 - 400 K/uL   nRBC 0.0 0.0 - 0.2 %    Comment: Performed at [facility], [address] [place], [place] [address]  Rapid urine drug screen (hospital performed)     Status: None   Collection Time: 10/11/21 12:37 PM  Result Value Ref Range   Opiates NONE DETECTED NONE DETECTED   Cocaine NONE DETECTED NONE DETECTED   Benzodiazepines NONE DETECTED NONE DETECTED   Amphetamines NONE DETECTED NONE DETECTED   Tetrahydrocannabinol  NONE DETECTED NONE DETECTED   Barbiturates NONE DETECTED NONE DETECTED    Comment: (NOTE) DRUG SCREEN FOR MEDICAL PURPOSES ONLY.  IF CONFIRMATION IS NEEDED FOR ANY PURPOSE, NOTIFY LAB WITHIN 5 DAYS.  LOWEST DETECTABLE LIMITS FOR URINE DRUG SCREEN Drug Class                     Cutoff (ng/mL) Amphetamine and metabolites    1000 Barbiturate and metabolites    200 Benzodiazepine                 [id number] Tricyclics and metabolites     300 Opiates and metabolites        300 Cocaine and metabolites        300 THC                            50 Performed at [facility], [place] [address] [place], [place] [address]   Urinalysis, Routine w reflex microscopic Urine, Clean Catch     Status: Abnormal   Collection Time: 10/11/21  4:44 PM  Result Value Ref Range   Color, Urine COLORLESS (A) YELLOW   APPearance CLEAR CLEAR   Specific Gravity, Urine 1.004 (L) 1.005 - 1.030   pH 6.0 5.0 - 8.0   Glucose, UA NEGATIVE  NEGATIVE mg/dL   Hgb urine dipstick NEGATIVE NEGATIVE   Bilirubin Urine NEGATIVE NEGATIVE   Ketones, ur NEGATIVE NEGATIVE mg/dL   Protein, ur NEGATIVE NEGATIVE mg/dL   Nitrite NEGATIVE NEGATIVE   Leukocytes,Ua NEGATIVE NEGATIVE    Comment: Performed at [place] [address] [place], [place] [address]    Blood Alcohol level:  Lab Results  Component Value Date   ETH 294 (H) 10/11/2021   ETH 249 (H) 08/29/2021    Physical Findings:  CIWA:  CIWA-Ar Total: 9 COWS:     Musculoskeletal: Strength & Muscle Tone: within normal limits Gait & Station: normal Patient leans: Front  Psychiatric Specialty Exam:  Presentation  General Appearance: Casual; Fairly Groomed  Eye Contact:Good  Speech:Clear and Coherent; Normal Rate  Speech Volume:Normal  Handedness:Right   Mood and Affect  Mood:Depressed; Anxious  Affect:Congruent   Thought Process  Thought Processes:Coherent; Goal Directed  Descriptions of Associations:Intact  Orientation:Full (Time, Place and Person)  Thought Content:Logical  History of Schizophrenia/Schizoaffective disorder:No data recorded Duration of Psychotic Symptoms:No data recorded Hallucinations:Hallucinations: None  Ideas of Reference:None  Suicidal Thoughts:Suicidal Thoughts: Yes, Active SI Active Intent and/or Plan: -- (Use any means available to kill himself.)  Homicidal Thoughts:Homicidal Thoughts: No   Sensorium  Memory:Immediate Good; Recent Good; Remote Fair  Judgment:Poor  Insight:Good   Executive Functions  Concentration:Good  Attention Span:Good  [place]  Language:Good   Psychomotor Activity  Psychomotor Activity:Psychomotor Activity: Tremor   Assets  Assets:Communication Skills   Sleep  Sleep:Sleep: Fair  Physical Exam: Physical Exam Vitals and nursing note reviewed.  Constitutional:      Appearance: Normal appearance.  HENT:     Head:  Normocephalic.     Nose: Nose normal.  Pulmonary:     Effort: Pulmonary effort is normal.  Musculoskeletal:        General: Normal range of motion.     Cervical back: Normal range of motion.  Skin:    General: Skin is warm and dry.  Neurological:     Mental Status: He is alert and oriented to person, place, and time.  Review of Systems  Constitutional: Negative.   HENT: Negative.    Eyes: Negative.   Respiratory: Negative.    Cardiovascular: Negative.   Gastrointestinal: Negative.   Genitourinary: Negative.   Musculoskeletal: Negative.   Skin: Negative.   Neurological: Negative.   Endo/Heme/Allergies: Negative.   Psychiatric/Behavioral:  Positive for depression and suicidal ideas. The patient is nervous/anxious.        Alcoholism   Blood pressure (!) 140/84, pulse 75, temperature 98.3 F (36.8 C), temperature source Oral, resp. rate 18, SpO2 93 %. There is no height or weight on file to calculate BMI.   Medical Decision Making: Patient is angry, disappointed he relapsed after rehab treatment.  He cannot contract for safety stating he is worthless and hopeless.  We will admit and seek bed placement.  Meanwhile we have started treating patient in the ER with appropriate medications and he is on CIWA protocol.  Problem 1: Recurrent Major Depressive disorder, moderate -severe without Psychotic features  Problem 2: Alcohol use disorder, severe use Disposition:  Seek bed placement, admit   Earney Navy, NP-PMHNP-BC 10/12/2021, 2:40 PM

## 2021-10-12 NOTE — ED Notes (Signed)
Pt given lunch tray.

## 2021-10-13 DIAGNOSIS — R45851 Suicidal ideations: Secondary | ICD-10-CM

## 2021-10-13 DIAGNOSIS — F1010 Alcohol abuse, uncomplicated: Secondary | ICD-10-CM

## 2021-10-13 DIAGNOSIS — F332 Major depressive disorder, recurrent severe without psychotic features: Secondary | ICD-10-CM

## 2021-10-13 MED ORDER — LORAZEPAM 2 MG/ML IJ SOLN: 1.0000 mg | INTRAMUSCULAR | Status: DC | PRN

## 2021-10-13 MED ORDER — THIAMINE MONONITRATE 100 MG PO TABS
100.0000 mg | ORAL_TABLET | Freq: Every day | ORAL | Status: DC
  Filled 2021-10-13 (×2): qty 1

## 2021-10-13 MED ORDER — THIAMINE HCL 100 MG/ML IJ SOLN: 100.0000 mg | Freq: Every day | INTRAMUSCULAR | Status: DC

## 2021-10-13 MED ORDER — LORAZEPAM 1 MG PO TABS
1.0000 mg | ORAL_TABLET | ORAL | Status: DC | PRN
  Administered 2021-10-13: 3 mg via ORAL
  Administered 2021-10-14: 1 mg via ORAL

## 2021-10-13 MED ORDER — FOLIC ACID 1 MG PO TABS
1.0000 mg | ORAL_TABLET | Freq: Every day | ORAL | Status: DC
  Filled 2021-10-13 (×2): qty 1

## 2021-10-13 MED ORDER — ADULT MULTIVITAMIN W/MINERALS CH
1.0000 | ORAL_TABLET | Freq: Every day | ORAL | Status: DC
  Administered 2021-10-14: 1 via ORAL
  Filled 2021-10-13: qty 1

## 2021-10-13 NOTE — Social Work (Signed)
CSW added in-patient as well out-patient rehabs to the Pt chart. TOC will continue to follow as if the Pt will need anything else to assist him in this process please reach out.

## 2021-10-13 NOTE — Consult Note (Signed)
Patient remains suicidal with any possible means.  Dylan Duke is interested in him.  Records are faxed out to them.  We will continue to monitor patient.  He is taking Sertraline, Quetiapine and Trazodone.  Waiting for available bed for admission

## 2021-10-13 NOTE — ED Provider Notes (Signed)
Emergency Medicine Observation Re-evaluation Note  Dylan Duke. is a 61 y.o. male, seen on rounds today.  Pt initially presented to the ED for complaints of Alcohol Intoxication, Suicidal, and Detox Currently, the patient is awaiting reassessment by psych for suicidal ideation.  Physical Exam  BP (!) 125/92 (BP Location: Left Arm)   Pulse 89   Temp 98 F (36.7 C) (Oral)   Resp 16   SpO2 100%  Physical Exam Patient is alert in no acute distress  ED Course / MDM  EKG:   I have reviewed the labs performed to date as well as medications administered while in observation.  Recent changes in the last 24 hours include none.  Plan  Current plan is for psych assessment for suicidal ideations today.    Bethann Berkshire, MD 10/13/21 [phone]

## 2021-10-13 NOTE — Progress Notes (Signed)
Pt is under review at [facility] and they are requesting the following: medical clearance note, and last [facility] assement note to be faxed to [phone].   CSW sent requested documents. CSW attempted to contact [facility] and left a voicemail requesting a phone call back.   Maryjean Ka, MSW, [facility] 10/13/2021 12:27 PM

## 2021-10-13 NOTE — ED Notes (Addendum)
Pt states he is experiencing anxiety and auditory hallucinations. Pt states he is hearing "you know what to do" "Do it" "Kill yourself". RN notified.

## 2021-10-13 NOTE — ED Notes (Signed)
Pt calm watching a movie on the computer.

## 2021-10-14 ENCOUNTER — Encounter (HOSPITAL_COMMUNITY): Payer: Self-pay | Admitting: Registered Nurse

## 2021-10-14 ENCOUNTER — Other Ambulatory Visit (HOSPITAL_COMMUNITY)
Admission: EM | Admit: 2021-10-14 | Discharge: 2021-10-18 | Disposition: A | Discharge status: Home / Self Care | Payer: Commercial Managed Care - HMO | Attending: Psychiatry | Admitting: Psychiatry

## 2021-10-14 DIAGNOSIS — F1020 Alcohol dependence, uncomplicated: Secondary | ICD-10-CM | POA: Diagnosis present

## 2021-10-14 DIAGNOSIS — R45851 Suicidal ideations: Secondary | ICD-10-CM | POA: Diagnosis not present

## 2021-10-14 DIAGNOSIS — F332 Major depressive disorder, recurrent severe without psychotic features: Secondary | ICD-10-CM | POA: Diagnosis not present

## 2021-10-14 DIAGNOSIS — F1021 Alcohol dependence, in remission: Secondary | ICD-10-CM

## 2021-10-14 MED ORDER — UMECLIDINIUM BROMIDE 62.5 MCG/ACT IN AEPB
1.0000 | INHALATION_SPRAY | Freq: Every day | RESPIRATORY_TRACT | Status: DC
  Administered 2021-10-15 – 2021-10-18 (×4): 1 via RESPIRATORY_TRACT
  Filled 2021-10-14: qty 7

## 2021-10-14 MED ORDER — ADULT MULTIVITAMIN W/MINERALS CH
1.0000 | ORAL_TABLET | Freq: Every day | ORAL | Status: DC
  Administered 2021-10-15 – 2021-10-18 (×4): 1 via ORAL
  Filled 2021-10-14 (×4): qty 1

## 2021-10-14 MED ORDER — ACETAMINOPHEN 325 MG PO TABS
650.0000 mg | ORAL_TABLET | Freq: Four times a day (QID) | ORAL | Status: DC | PRN
  Administered 2021-10-16: 650 mg via ORAL
  Filled 2021-10-14: qty 2

## 2021-10-14 MED ORDER — THIAMINE MONONITRATE 100 MG PO TABS
100.0000 mg | ORAL_TABLET | Freq: Every day | ORAL | Status: DC
  Administered 2021-10-15 – 2021-10-18 (×4): 100 mg via ORAL
  Filled 2021-10-14 (×4): qty 1

## 2021-10-14 MED ORDER — NICOTINE 21 MG/24HR TD PT24
21.0000 mg | MEDICATED_PATCH | Freq: Once | TRANSDERMAL | Status: DC
  Administered 2021-10-14: 21 mg via TRANSDERMAL
  Filled 2021-10-14: qty 1

## 2021-10-14 MED ORDER — HYDROXYZINE HCL 25 MG PO TABS: 25.0000 mg | ORAL_TABLET | Freq: Three times a day (TID) | ORAL | Status: DC | PRN

## 2021-10-14 MED ORDER — QUETIAPINE FUMARATE 100 MG PO TABS
100.0000 mg | ORAL_TABLET | Freq: Every day | ORAL | Status: DC
Start: 2021-10-14 — End: 2021-10-18
  Administered 2021-10-14 – 2021-10-17 (×4): 100 mg via ORAL
  Filled 2021-10-14 (×4): qty 1

## 2021-10-14 MED ORDER — TIOTROPIUM BROMIDE MONOHYDRATE 2.5 MCG/ACT IN AERS: 2.0000 | INHALATION_SPRAY | Freq: Every day | RESPIRATORY_TRACT | Status: DC

## 2021-10-14 MED ORDER — CLONAZEPAM 1 MG PO TABS
1.0000 mg | ORAL_TABLET | Freq: Every day | ORAL | Status: DC
  Administered 2021-10-14: 1 mg via ORAL
  Filled 2021-10-14: qty 1

## 2021-10-14 MED ORDER — APIXABAN 5 MG PO TABS
5.0000 mg | ORAL_TABLET | Freq: Two times a day (BID) | ORAL | Status: DC
  Administered 2021-10-14 – 2021-10-18 (×8): 5 mg via ORAL
  Filled 2021-10-14 (×8): qty 1

## 2021-10-14 MED ORDER — TRAZODONE HCL 100 MG PO TABS
100.0000 mg | ORAL_TABLET | Freq: Every day | ORAL | Status: DC
  Administered 2021-10-14 – 2021-10-15 (×2): 100 mg via ORAL
  Filled 2021-10-14 (×2): qty 1

## 2021-10-14 MED ORDER — SERTRALINE HCL 25 MG PO TABS
25.0000 mg | ORAL_TABLET | Freq: Every day | ORAL | Status: DC
  Administered 2021-10-15: 25 mg via ORAL
  Filled 2021-10-14: qty 1

## 2021-10-14 MED ORDER — LACTULOSE 10 GM/15ML PO SOLN
20.0000 g | Freq: Two times a day (BID) | ORAL | Status: DC
  Administered 2021-10-14 – 2021-10-15 (×3): 20 g via ORAL
  Filled 2021-10-14 (×5): qty 30

## 2021-10-14 MED ORDER — GABAPENTIN 300 MG PO CAPS
300.0000 mg | ORAL_CAPSULE | Freq: Three times a day (TID) | ORAL | Status: DC
  Administered 2021-10-14 – 2021-10-18 (×12): 300 mg via ORAL
  Filled 2021-10-14 (×12): qty 1

## 2021-10-14 MED ORDER — FOLIC ACID 1 MG PO TABS
1.0000 mg | ORAL_TABLET | Freq: Every day | ORAL | Status: DC
Start: 2021-10-14 — End: 2021-10-18
  Administered 2021-10-15 – 2021-10-18 (×4): 1 mg via ORAL
  Filled 2021-10-14 (×4): qty 1

## 2021-10-14 MED ORDER — ALUM & MAG HYDROXIDE-SIMETH 200-200-20 MG/5ML PO SUSP: 30.0000 mL | ORAL | Status: DC | PRN

## 2021-10-14 MED ORDER — ALBUTEROL SULFATE HFA 108 (90 BASE) MCG/ACT IN AERS: 2.0000 | INHALATION_SPRAY | Freq: Two times a day (BID) | RESPIRATORY_TRACT | Status: DC | PRN

## 2021-10-14 MED ORDER — MAGNESIUM HYDROXIDE 400 MG/5ML PO SUSP: 30.0000 mL | Freq: Every day | ORAL | Status: DC | PRN

## 2021-10-14 NOTE — ED Provider Notes (Signed)
Emergency Medicine Observation Re-evaluation Note  Dylan Duke. is a 62 y.o. male, seen on rounds today.  Pt initially presented to the ED for complaints of Alcohol Intoxication, Suicidal, and Detox Currently, the patient is awaiting psych bed for suicidal ideation.  Physical Exam  BP 120/82 (BP Location: Left Arm)   Pulse 94   Temp 97.6 F (36.4 C) (Oral)   Resp 18   SpO2 93%  Physical Exam Patient is alert in no acute distress ED Course / MDM  EKG:   I have reviewed the labs performed to date as well as medications administered while in observation.  Recent changes in the last 24 hours include none.  Plan  Current plan is for psych placement for suicidal ideation.    Bethann Berkshire, MD 10/14/21 [phone]

## 2021-10-14 NOTE — Progress Notes (Signed)
Inpatient Behavioral Health placement  Pt meets inpatient criteria per Dahlia Byes, NP.  Referral was sent to the following facilities;   Destination Service Provider Address Phone Fax  [facility]  [address], [place] [address] [phone]  [facility] [address] [place]  [address] [place], [place] [address] [phone]  [facility]  [address] [place], [place] [address] [phone]  [facility]  [address] [place] [address] [phone]  [facility]  [address] [place], [place] [address] [phone]  [facility]  [address] [place] [address] [phone]  [facility]  [address], [place] [address] [phone]  [facility]  [address] [place] [address] [phone]  [facility]  [address] [place] [address] [phone]  [facility]  [address] [place] [address] [phone]  [facility]  [address] [place] [address] [phone]  [facility]  [address], [place] [address] [phone]  [facility]  [address] [place] [address] [phone]  [facility]  [address] [place] [address] [phone]  [facility]  [address], [place] [address] [phone]  [facility]  [address] [place] [address] [phone]  [facility]  [address] [place] [address] [phone]   [facility]  [address] [place] [address] [phone]    Situation ongoing,  CSW will follow up.   Maryjean Ka, MSW, LCSWA 10/14/2021  @ 1:29 AM

## 2021-10-14 NOTE — Progress Notes (Signed)
[facility] MD Progress Note  10/14/2021 12:58 PM Dylan Pel.  MRN:  [id number]   Subjective:  Dylan Duke was seen and evaluated face-to-face by this provider.  Continues to endorse suicidal ideations however denying plan or intent.  States struggling with depression and anxiety.  States he recently started drinking more heavily over the past 3 months.  States he was admitted to a facility in the past for substance abuse " [facility]".  States he feels like his depression is when him down.  Patient was initially accepted to facility based crisis center however after RN attempted to give report tit was reported that patient endorsed a plan " what ever way possible."  NP spoke to patient regarding reported symptoms.  States " I forgot the plan, I just needed help with my depression."   Contacted nursing staff. Patient to be transported to [facility], for stabilization.   Principal Problem: Major depressive disorder, recurrent episode, severe (HCC) Diagnosis: Principal Problem:   Major depressive disorder, recurrent episode, severe (HCC) Active Problems:   Alcohol abuse  Total Time spent with patient: 15 minutes  Past Psychiatric History:   Past Medical History: History reviewed. No pertinent past medical history. History reviewed. No pertinent surgical history. Family History: History reviewed. No pertinent family history. Family Psychiatric  History:  Social History:  Social History   Substance and Sexual Activity  Alcohol Use Not Currently     Social History   Substance and Sexual Activity  Drug Use Never    Social History   Socioeconomic History   Marital status: Divorced    Spouse name: Not on file   Number of children: Not on file   Years of education: Not on file   Highest education level: Not on file  Occupational History   Not on file  Tobacco Use   Smoking status: Some Days   Smokeless tobacco: Never  Vaping Use   Vaping Use: Some days  Substance and Sexual Activity    Alcohol use: Not Currently   Drug use: Never   Sexual activity: Not on file  Other Topics Concern   Not on file  Social History Narrative   ** Merged History Encounter **       Social Determinants of Health   Financial Resource Strain: Not on file  Food Insecurity: Not on file  Transportation Needs: Not on file  Physical Activity: Not on file  Stress: Not on file  Social Connections: Not on file   Additional Social History:                         Sleep: Good  Appetite:  Good  Current Medications: Current Facility-Administered Medications  Medication Dose Route Frequency Provider Last Rate Last Admin   acetaminophen (TYLENOL) tablet 650 mg  650 mg Oral Q4H PRN Jacalyn Lefevre, MD       albuterol (VENTOLIN HFA) 108 (90 Base) MCG/ACT inhaler 1-2 puff  1-2 puff Inhalation Q6H Tilden Fossa, MD   2 puff at 10/14/21 0739   albuterol (VENTOLIN HFA) 108 (90 Base) MCG/ACT inhaler 2 puff  2 puff Inhalation BID PRN Jacalyn Lefevre, MD       apixaban Everlene Balls) tablet 5 mg  5 mg Oral BID Jacalyn Lefevre, MD   5 mg at 10/14/21 2423   folic acid (FOLVITE) tablet 1 mg  1 mg Oral Daily Jacalyn Lefevre, MD   1 mg at 10/14/21 0953   folic acid (FOLVITE) tablet 1 mg  1 mg Oral Daily Lonell Grandchild, MD       gabapentin (NEURONTIN) capsule 300 mg  300 mg Oral TID Jacalyn Lefevre, MD   300 mg at 10/14/21 0951   lactulose (CHRONULAC) 10 GM/15ML solution 20 g  20 g Oral Daily PRN Jacalyn Lefevre, MD       LORazepam (ATIVAN) injection 0-4 mg  0-4 mg Intravenous Q12H Redwine, Madison A, PA-C       Or   LORazepam (ATIVAN) tablet 0-4 mg  0-4 mg Oral Q12H Redwine, Madison A, PA-C       LORazepam (ATIVAN) tablet 1-4 mg  1-4 mg Oral Q1H PRN Lonell Grandchild, MD   1 mg at 10/14/21 0740   Or   LORazepam (ATIVAN) injection 1-4 mg  1-4 mg Intravenous Q1H PRN Lonell Grandchild, MD       multivitamin with minerals tablet 1 tablet  1 tablet Oral Daily Jacalyn Lefevre, MD   1 tablet at  10/13/21 0900   multivitamin with minerals tablet 1 tablet  1 tablet Oral Daily Lonell Grandchild, MD   1 tablet at 10/14/21 0948   QUEtiapine (SEROQUEL) tablet 100 mg  100 mg Oral QHS Jacalyn Lefevre, MD   100 mg at 10/13/21 2229   sertraline (ZOLOFT) tablet 25 mg  25 mg Oral Daily Jacalyn Lefevre, MD   25 mg at 10/14/21 3151   thiamine (VITAMIN B1) tablet 100 mg  100 mg Oral Daily Redwine, Madison A, PA-C   100 mg at 10/14/21 7616   Or   thiamine (VITAMIN B1) injection 100 mg  100 mg Intravenous Daily Redwine, Madison A, PA-C       thiamine (VITAMIN B1) tablet 100 mg  100 mg Oral Daily Lonell Grandchild, MD       Or   thiamine (VITAMIN B1) injection 100 mg  100 mg Intravenous Daily Lonell Grandchild, MD       traZODone (DESYREL) tablet 100 mg  100 mg Oral QHS Jacalyn Lefevre, MD   100 mg at 10/13/21 2230   umeclidinium bromide (INCRUSE ELLIPTA) 62.5 MCG/ACT 1 puff  1 puff Inhalation Daily Cindi Carbon, RPH   1 puff at 10/14/21 0740   Current Outpatient Medications  Medication Sig Dispense Refill   albuterol (VENTOLIN HFA) 108 (90 Base) MCG/ACT inhaler Inhale 2 puffs into the lungs 2 (two) times daily as needed.     clonazePAM (KLONOPIN) 1 MG tablet Take 1 mg by mouth in the morning and at bedtime.     cyclobenzaprine (FLEXERIL) 10 MG tablet Take 10 mg by mouth daily.     ELIQUIS 5 MG TABS tablet Take 5 mg by mouth 2 (two) times daily.     folic acid (FOLVITE) 1 MG tablet Take 1 mg by mouth daily.     gabapentin (NEURONTIN) 300 MG capsule Take 300 mg by mouth 3 (three) times daily.     ibuprofen (ADVIL) 400 MG tablet Take 400 mg by mouth every 6 (six) hours as needed.     lactulose (CHRONULAC) 10 GM/15ML solution Take 20 g by mouth in the morning and at bedtime.     Multiple Vitamin (DAILY VITES) tablet Take 1 tablet by mouth daily.     QUEtiapine (SEROQUEL) 100 MG tablet Take 100 mg by mouth at bedtime.     sertraline (ZOLOFT) 25 MG tablet Take 25 mg by mouth daily.     thiamine  (VITAMIN B1) 100 MG tablet Take 100 mg by  mouth daily.     Tiotropium Bromide Monohydrate 2.5 MCG/ACT AERS Inhale 2 puffs into the lungs daily.     traZODone (DESYREL) 100 MG tablet Take 100 mg by mouth at bedtime.     benzonatate (TESSALON) 100 MG capsule Take 1 capsule (100 mg total) by mouth every 8 (eight) hours. (Patient not taking: Reported on 10/11/2021) 15 capsule 0   chlordiazePOXIDE (LIBRIUM) 25 MG capsule 50mg  PO TID x 1D, then 25-50mg  PO BID X 1D, then 25-50mg  PO QD X 1D (Patient not taking: Reported on 10/11/2021) 10 capsule 0   predniSONE (DELTASONE) 10 MG tablet Take 6 tabs day one, 5 tabs day two, 4 tabs day three, etc (Patient not taking: Reported on 10/11/2021) 21 tablet 0    Lab Results: No results found for this or any previous visit (from the past 48 hour(s)).  Blood Alcohol level:  Lab Results  Component Value Date   ETH 294 (H) 10/11/2021   ETH 249 (H) 08/29/2021    Metabolic Disorder Labs: No results found for: "HGBA1C", "MPG" No results found for: "PROLACTIN" No results found for: "CHOL", "TRIG", "HDL", "CHOLHDL", "VLDL", "LDLCALC"  Physical Findings: AIMS:  , ,  ,  ,    CIWA:  CIWA-Ar Total: 9 COWS:     Musculoskeletal: Strength & Muscle Tone: within normal limits Gait & Station: normal Patient leans: N/A  Psychiatric Specialty Exam:  Presentation  General Appearance: Casual; Fairly Groomed  Eye Contact:Good  Speech:Clear and Coherent; Normal Rate  Speech Volume:Normal  Handedness:Right   Mood and Affect  Mood:Depressed; Anxious  Affect:Congruent   Thought Process  Thought Processes:Coherent; Goal Directed  Descriptions of Associations:Intact  Orientation:Full (Time, Place and Person)  Thought Content:Logical  History of Schizophrenia/Schizoaffective disorder:No data recorded Duration of Psychotic Symptoms:No data recorded Hallucinations:No data recorded Ideas of Reference:None  Suicidal Thoughts:No data recorded Homicidal  Thoughts:No data recorded  Sensorium  Memory:Immediate Good; Recent Good; Remote Fair  Judgment:Poor  Insight:Good   Executive Functions  Concentration:Good  Attention Span:Good  Recall:Good  Fund of Knowledge:Fair  Language:Good   Psychomotor Activity  Psychomotor Activity:No data recorded  Assets  Assets:Communication Skills   Sleep  Sleep:No data recorded   Physical Exam: Physical Exam Vitals and nursing note reviewed.  Cardiovascular:     Rate and Rhythm: Normal rate and regular rhythm.  Neurological:     Mental Status: He is alert.  Psychiatric:        Mood and Affect: Mood normal.        Thought Content: Thought content normal.    Review of Systems  Psychiatric/Behavioral:  Positive for depression, substance abuse and suicidal ideas.   All other systems reviewed and are negative.  Blood pressure 120/82, pulse 94, temperature 97.6 F (36.4 C), temperature source Oral, resp. rate 18, SpO2 93 %. There is no height or weight on file to calculate BMI.   Treatment Plan Summary: Daily contact with patient to assess and evaluate symptoms and progress in treatment and Medication management  Patient to be transfer to [facility]  [facility], NP 10/14/2021, 12:58 PM

## 2021-10-14 NOTE — ED Notes (Addendum)
Secure chat sent to Sindy Guadeloupe, NP to verify pt's scheduled lactulose order. Pt states he takes lactulose because of his liver but it has caused him to be incontinent of bowel movements at night since he has started taking it.

## 2021-10-14 NOTE — ED Notes (Signed)
Attending AA

## 2021-10-14 NOTE — ED Notes (Signed)
Pt sitting in dining room watching TV. A&O x4, calm and cooperative. Denies current SI/HI/AVH. No signs of distress noted. Monitoring for safety.  

## 2021-10-14 NOTE — ED Notes (Signed)
Patient A&Ox4. Denies intent to harm self/others when asked. Denies A/VH. Patient denies any physical complaints when asked pertaining to ETOH abuse. No acute distress noted. Pt reported, "I have incontinence of my bowel every night. I need diapers. It happen every night for about 2 weeks now. I wake up and I've s**t in my pants. I need diapers or it will happen". Support provided. Pt given incontinent briefs to wear at night. Pt currently wearing diaper given at [facility]. Routine safety checks conducted according to facility protocol. Encouraged patient to notify staff if thoughts of harm toward self or others arise. Patient verbalize understanding and agreement. Will continue to monitor for safety.

## 2021-10-14 NOTE — ED Provider Notes (Signed)
Facility Based Crisis Admission H&P  Date: 10/14/21 Patient Name: Dylan Duke. MRN: [id number] Chief Complaint: No chief complaint on file.     Diagnoses:  Final diagnoses:  Alcohol use disorder, severe, in early remission (HCC)  Severe episode of recurrent major depressive disorder, without psychotic features (HCC)  Suicidal ideation    HPI: Dylan Pel., 61 y.o., male patient presents to [facility] for direct admit to facility based crisis unit from [facility] ED where he initially presented with complaints of depression, anxiety, increase in alcohol consumption.    Per Chart Review:  NP psychiatric assessment 10/14/21 [facility] ED:  Dylan Duke was seen and evaluated face-to-face by this provider.  Continues to endorse suicidal ideations however denying plan or intent.  States struggling with depression and anxiety.  States he recently started drinking more heavily over the past 3 months.  States he was admitted to a facility in the past for substance abuse " [facility]".  States he feels like his depression is when him down. Patient was initially accepted to facility based crisis center however after RN attempted to give report it was reported that patient endorsed a plan " what ever way possible."  NP spoke to patient regarding reported symptoms.  States " I forgot the plan, I just needed help with my depression."   Dylan Pel. seen face to face by this provider, consulted with Dr. Nelly Rout; and chart reviewed on 10/14/21.  On evaluation Dylan Pel. Continues to report depression related to recent relapse on alcohol.  Patient states he recently finished a detox and rehab program and over the weekend he drank a 12 pk of beer and then on another day he started drinking again 2 or 3 beers and "I was like I said I wasn't going to do this no more.  I'm just disgusted with myself."  States he needs to get his head together and may need more rehab.  States he is currently unemployed  "I lost my job when I went to detox.  States that he is currently homeless and may not have any more support "after this time I probably wont.  My dad has had enough."  At this time patient denies suicidal/self-harm/homicidal ideation, psychosis, and paranoia.  Wanting to get his head clear and set up with community services for substance use (alcohol abuse).  Denies outpatient psychiatric services During evaluation Dylan Duke. is alert/oriented x 4; calm/cooperative; and mood is congruent with affect.  He does not appear to be responding to internal/external stimuli or delusional thoughts.  Patient denies suicidal/self-harm/homicidal ideation, psychosis, and paranoia.  Patient answered question appropriately.     PHQ 2-9:   Flowsheet Row ED from 10/11/2021 in [facility] from 08/29/2021 in [facility] EM[facility]GENCY DEPART[facility] ED from 08/20/2021 in [facility]  C-SSRS RISK CATEGORY Moderate Risk Moderate Risk No Risk        Total Time spent with patient: 30 minutes  Musculoskeletal  Strength & Muscle Tone: within normal limits Gait & Station: normal Patient leans: N/A  Psychiatric Specialty Exam  Presentation General Appearance: Appropriate for Environment  Eye Contact:Good  Speech:Clear and Coherent; Normal Rate  Speech Volume:Normal  Handedness:Right   Mood and Affect  Mood:Dysphoric  Affect:Congruent   Thought Process  Thought Processes:Coherent; Goal Directed  Descriptions of Associations:Intact  Orientation:Full (Time, Place and Person)  Thought Content:Logical    Hallucinations:Hallucinations: None  Ideas of Reference:None  Suicidal Thoughts:Suicidal Thoughts:  No  Homicidal Thoughts:Homicidal Thoughts: No   Sensorium  Memory:Immediate Good; Recent Good  Judgment:Fair  Insight:Present   Executive Functions  Concentration:Good  Attention  Span:Good  Recall:Good  Fund of Knowledge:Good  Language:Good   Psychomotor Activity  Psychomotor Activity:Psychomotor Activity: Normal   Assets  Assets:Communication Skills; Desire for Improvement; Financial Resources/Insurance; Social Support   Sleep  Sleep:Sleep: Good   Nutritional Assessment (For OBS and FBC admissions only) Has the patient had a weight loss or gain of 10 pounds or more in the last 3 months?: No Has the patient had a decrease in food intake/or appetite?: No Does the patient have dental problems?: No Does the patient have eating habits or behaviors that may be indicators of an eating disorder including binging or inducing vomiting?: No Has the patient recently lost weight without trying?: 0 Has the patient been eating poorly because of a decreased appetite?: 0 Malnutrition Screening Tool Score: 0    Physical Exam Vitals and nursing note reviewed. Exam conducted with a chaperone present.  Constitutional:      General: He is not in acute distress.    Appearance: Normal appearance. He is not ill-appearing.  Eyes:     Pupils: Pupils are equal, round, and reactive to light.  Cardiovascular:     Rate and Rhythm: Normal rate.  Pulmonary:     Effort: Pulmonary effort is normal.  Musculoskeletal:        General: Normal range of motion.     Cervical back: Normal range of motion.  Skin:    General: Skin is warm and dry.  Neurological:     Mental Status: He is alert and oriented to person, place, and time.  Psychiatric:        Attention and Perception: Attention and perception normal. He does not perceive auditory or visual hallucinations.        Mood and Affect: Mood is depressed.        Speech: Speech normal.        Behavior: Behavior normal. Behavior is cooperative.        Thought Content: Thought content normal. Thought content is not delusional. Thought content does not include homicidal or suicidal ideation.        Cognition and Memory: Cognition  normal.        Judgment: Judgment is impulsive.    Review of Systems  Constitutional: Negative.   Eyes: Negative.   Respiratory: Negative.    Cardiovascular: Negative.   Gastrointestinal: Negative.   Genitourinary: Negative.   Musculoskeletal: Negative.   Skin: Negative.   Neurological: Negative.   Endo/Heme/Allergies: Negative.   Psychiatric/Behavioral:  Positive for depression. Hallucinations: Denies. Substance abuse: Alcohol. Suicidal ideas: Denies.Nervous/anxious: Stable.     There were no vitals taken for this visit. There is no height or weight on file to calculate BMI.  Past Psychiatric History: See above    Is the patient at risk to self? No  Has the patient been a risk to self in the past 6 months? No .    Has the patient been a risk to self within the distant past? No   Is the patient a risk to others? No   Has the patient been a risk to others in the past 6 months? No   Has the patient been a risk to others within the distant past? No   Past Medical History: History reviewed. No pertinent past medical history. History reviewed. No pertinent surgical history.  Family History: History reviewed. No pertinent  family history.  Social History:  Social History   Socioeconomic History   Marital status: Divorced    Spouse name: Not on file   Number of children: Not on file   Years of education: Not on file   Highest education level: Not on file  Occupational History   Not on file  Tobacco Use   Smoking status: Some Days   Smokeless tobacco: Never  Vaping Use   Vaping Use: Some days  Substance and Sexual Activity   Alcohol use: Not Currently   Drug use: Never   Sexual activity: Not on file  Other Topics Concern   Not on file  Social History Narrative   ** Merged History Encounter **       Social Determinants of Health   Financial Resource Strain: Not on file  Food Insecurity: Not on file  Transportation Needs: Not on file  Physical Activity: Not on  file  Stress: Not on file  Social Connections: Not on file  Intimate Partner Violence: Not on file    SDOH:  SDOH Screenings   Tobacco Use: High Risk (10/14/2021)    Last Labs:  Admission on 10/11/2021, Discharged on 10/14/2021  Component Date Value Ref Range Status   Sodium 10/11/2021 133 (L)  135 - 145 mmol/L Final   Potassium 10/11/2021 4.3  3.5 - 5.1 mmol/L Final   Chloride 10/11/2021 98  98 - 111 mmol/L Final   CO2 10/11/2021 24  22 - 32 mmol/L Final   Glucose, Bld 10/11/2021 91  70 - 99 mg/dL Final   Glucose reference range applies only to samples taken after fasting for at least 8 hours.   BUN 10/11/2021 11  8 - 23 mg/dL Final   Creatinine, Ser 10/11/2021 0.92  0.61 - 1.24 mg/dL Final   Calcium 57/32/2025 8.1 (L)  8.9 - 10.3 mg/dL Final   Total Protein 42/70/6237 6.6  6.5 - 8.1 g/dL Final   Albumin 62/83/1517 4.0  3.5 - 5.0 g/dL Final   AST 61/60/7371 31  15 - 41 U/L Final   ALT 10/11/2021 38  0 - 44 U/L Final   Alkaline Phosphatase 10/11/2021 73  38 - 126 U/L Final   Total Bilirubin 10/11/2021 0.7  0.3 - 1.2 mg/dL Final   GFR, Estimated 10/11/2021 >60  >60 mL/min Final   Comment: (NOTE) Calculated using the CKD-EPI Creatinine Equation (2021)    Anion gap 10/11/2021 11  5 - 15 Final   Performed at [facility], [address] [place], [place] [address]   Alcohol, Ethyl (B) 10/11/2021 294 (H)  <10 mg/dL Final   Comment: (NOTE) Lowest detectable limit for serum alcohol is 10 mg/dL.  For medical purposes only. Performed at [facility], [address], [place], [place] [address]    Salicylate Lvl 10/11/2021 <7.0 (L)  7.0 - 30.0 mg/dL Final   Performed at [facility], [address], [place], [place] [address]   Acetaminophen (Tylenol), Serum 10/11/2021 <10 (L)  10 - 30 ug/mL Final   Comment: (NOTE) Therapeutic concentrations vary significantly. A range of 10-30 ug/mL  may be an effective concentration for  many patients. However, some  are best treated at concentrations outside of this range. Acetaminophen concentrations >150 ug/mL at 4 hours after ingestion  and >50 ug/mL at 12 hours after ingestion are often associated with  toxic reactions.  Performed at [facility], [address] [place], [place] [address]  WBC 10/11/2021 5.2  4.0 - 10.5 K/uL Final   RBC 10/11/2021 4.71  4.22 - 5.81 MIL/uL Final   Hemoglobin 10/11/2021 14.6  13.0 - 17.0 g/dL Final   HCT 77/93/9030 43.9  39.0 - 52.0 % Final   MCV 10/11/2021 93.2  80.0 - 100.0 fL Final   MCH 10/11/2021 31.0  26.0 - 34.0 pg Final   MCHC 10/11/2021 33.3  30.0 - 36.0 g/dL Final   RDW 10/31/3005 13.2  11.5 - 15.5 % Final   Platelets 10/11/2021 193  150 - 400 K/uL Final   nRBC 10/11/2021 0.0  0.0 - 0.2 % Final   Performed at [facility], [address] [place], [place] [address]   SARS Coronavirus 2 by RT PCR 10/11/2021 NEGATIVE  NEGATIVE Final   Comment: (NOTE) SARS-CoV-2 target nucleic acids are NOT DETECTED.  The SARS-CoV-2 RNA is generally detectable in upper respiratory specimens during the acute phase of infection. The lowest concentration of SARS-CoV-2 viral copies this assay can detect is 138 copies/mL. A negative result does not preclude SARS-Cov-2 infection and should not be used as the sole basis for treatment or other patient management decisions. A negative result may occur with  improper specimen collection/handling, submission of specimen other than nasopharyngeal swab, presence of viral mutation(s) within the areas targeted by this assay, and inadequate number of viral copies(<138 copies/mL). A negative result must be combined with clinical observations, patient history, and epidemiological information. The expected result is Negative.  Fact Sheet for Patients:  BloggerCourse.com  Fact Sheet for Healthcare Providers:   SeriousBroker.it  This test is no                          t yet approved or cleared by the [place] FDA and  has been authorized for detection and/or diagnosis of SARS-CoV-2 by FDA under an Emergency Use Authorization (EUA). This EUA will remain  in effect (meaning this test can be used) for the duration of the COVID-19 declaration under Section 564(b)(1) of the Act, 21 U.S.C.section 360bbb-3(b)(1), unless the authorization is terminated  or revoked sooner.       Influenza A by PCR 10/11/2021 NEGATIVE  NEGATIVE Final   Influenza B by PCR 10/11/2021 NEGATIVE  NEGATIVE Final   Comment: (NOTE) The Xpert Xpress SARS-CoV-2/FLU/RSV plus assay is intended as an aid in the diagnosis of influenza from Nasopharyngeal swab specimens and should not be used as a sole basis for treatment. Nasal washings and aspirates are unacceptable for Xpert Xpress SARS-CoV-2/FLU/RSV testing.  Fact Sheet for Patients: BloggerCourse.com  Fact Sheet for Healthcare Providers: SeriousBroker.it  This test is not yet approved or cleared by the [place] FDA and has been authorized for detection and/or diagnosis of SARS-CoV-2 by FDA under an Emergency Use Authorization (EUA). This EUA will remain in effect (meaning this test can be used) for the duration of the COVID-19 declaration under Section 564(b)(1) of the Act, 21 U.S.C. section 360bbb-3(b)(1), unless the authorization is terminated or revoked.  Performed at [facility], [address], [place], [place] [address]    Opiates 10/11/2021 NONE DETECTED  NONE DETECTED Final   Cocaine 10/11/2021 NONE DETECTED  NONE DETECTED Final   Benzodiazepines 10/11/2021 NONE DETECTED  NONE DETECTED Final   Amphetamines 10/11/2021 NONE DETECTED  NONE DETECTED Final   Tetrahydrocannabinol 10/11/2021 NONE DETECTED  NONE DETECTED Final   Barbiturates 10/11/2021 NONE  DETECTED  NONE DETECTED Final  Comment: (NOTE) DRUG SCREEN FOR MEDICAL PURPOSES ONLY.  IF CONFIRMATION IS NEEDED FOR ANY PURPOSE, NOTIFY LAB WITHIN 5 DAYS.  LOWEST DETECTABLE LIMITS FOR URINE DRUG SCREEN Drug Class                     Cutoff (ng/mL) Amphetamine and metabolites    1000 Barbiturate and metabolites    200 Benzodiazepine                 200 Tricyclics and metabolites     300 Opiates and metabolites        300 Cocaine and metabolites        300 THC                            50 Performed at [facility], [address] [place], [place] [address]    Color, Urine 10/11/2021 COLORLESS (A)  YELLOW Final   APPearance 10/11/2021 CLEAR  CLEAR Final   Specific Gravity, Urine 10/11/2021 1.004 (L)  1.005 - 1.030 Final   pH 10/11/2021 6.0  5.0 - 8.0 Final   Glucose, UA 10/11/2021 NEGATIVE  NEGATIVE mg/dL Final   Hgb urine dipstick 10/11/2021 NEGATIVE  NEGATIVE Final   Bilirubin Urine 10/11/2021 NEGATIVE  NEGATIVE Final   Ketones, ur 10/11/2021 NEGATIVE  NEGATIVE mg/dL Final   Protein, ur 18/84/1660 NEGATIVE  NEGATIVE mg/dL Final   Nitrite 63/02/6008 NEGATIVE  NEGATIVE Final   Leukocytes,Ua 10/11/2021 NEGATIVE  NEGATIVE Final   Performed at [facility], [address], [place], [place] [address]  Admission on 08/29/2021, Discharged on 08/29/2021  Component Date Value Ref Range Status   Lipase 08/29/2021 33  11 - 51 U/L Final   Performed at [facility], [address] [place], [place] [address]   Sodium 08/29/2021 137  135 - 145 mmol/L Final   Potassium 08/29/2021 3.9  3.5 - 5.1 mmol/L Final   Chloride 08/29/2021 100  98 - 111 mmol/L Final   CO2 08/29/2021 20 (L)  22 - 32 mmol/L Final   Glucose, Bld 08/29/2021 78  70 - 99 mg/dL Final   Glucose reference range applies only to samples taken after fasting for at least 8 hours.   BUN 08/29/2021 8  8 - 23 mg/dL Final   Creatinine, Ser 08/29/2021 0.81  0.61 - 1.24 mg/dL Final    Calcium 02/54/2706 8.7 (L)  8.9 - 10.3 mg/dL Final   Total Protein 23/76/2831 6.8  6.5 - 8.1 g/dL Final   Albumin 51/76/1607 4.4  3.5 - 5.0 g/dL Final   AST 37/11/6267 100 (H)  15 - 41 U/L Final   ALT 08/29/2021 61 (H)  0 - 44 U/L Final   Alkaline Phosphatase 08/29/2021 72  38 - 126 U/L Final   Total Bilirubin 08/29/2021 0.6  0.3 - 1.2 mg/dL Final   GFR, Estimated 08/29/2021 >60  >60 mL/min Final   Comment: (NOTE) Calculated using the CKD-EPI Creatinine Equation (2021)    Anion gap 08/29/2021 17 (H)  5 - 15 Final   Performed at [facility], [address], [place], [place] [address]   WBC 08/29/2021 5.3  4.0 - 10.5 K/uL Final   RBC 08/29/2021 4.69  4.22 - 5.81 MIL/uL Final   Hemoglobin 08/29/2021 14.9  13.0 - 17.0 g/dL Final   HCT 27/04/5007 42.2  39.0 - 52.0 % Final   MCV 08/29/2021 90.0  80.0 -  100.0 fL Final   MCH 08/29/2021 31.8  26.0 - 34.0 pg Final   MCHC 08/29/2021 35.3  30.0 - 36.0 g/dL Final   RDW 20/11/710 13.2  11.5 - 15.5 % Final   Platelets 08/29/2021 145 (L)  150 - 400 K/uL Final   nRBC 08/29/2021 0.0  0.0 - 0.2 % Final   Performed at [facility], [address] [place], [place] [address]   Color, Urine 08/29/2021 STRAW (A)  YELLOW Final   APPearance 08/29/2021 CLEAR  CLEAR Final   Specific Gravity, Urine 08/29/2021 1.003 (L)  1.005 - 1.030 Final   pH 08/29/2021 6.0  5.0 - 8.0 Final   Glucose, UA 08/29/2021 NEGATIVE  NEGATIVE mg/dL Final   Hgb urine dipstick 08/29/2021 NEGATIVE  NEGATIVE Final   Bilirubin Urine 08/29/2021 NEGATIVE  NEGATIVE Final   Ketones, ur 08/29/2021 NEGATIVE  NEGATIVE mg/dL Final   Protein, ur 83/25/4982 NEGATIVE  NEGATIVE mg/dL Final   Nitrite 64/15/8309 NEGATIVE  NEGATIVE Final   Leukocytes,Ua 08/29/2021 NEGATIVE  NEGATIVE Final   Performed at [facility], [address], [place], [place] [address]   Opiates 08/29/2021 NONE DETECTED  NONE DETECTED Final   Cocaine 08/29/2021 NONE DETECTED  NONE DETECTED Final    Benzodiazepines 08/29/2021 POSITIVE (A)  NONE DETECTED Final   Amphetamines 08/29/2021 NONE DETECTED  NONE DETECTED Final   Tetrahydrocannabinol 08/29/2021 NONE DETECTED  NONE DETECTED Final   Barbiturates 08/29/2021 NONE DETECTED  NONE DETECTED Final   Comment: (NOTE) DRUG SCREEN FOR MEDICAL PURPOSES ONLY.  IF CONFIRMATION IS NEEDED FOR ANY PURPOSE, NOTIFY LAB WITHIN 5 DAYS.  LOWEST DETECTABLE LIMITS FOR URINE DRUG SCREEN Drug Class                     Cutoff (ng/mL) Amphetamine and metabolites    1000 Barbiturate and metabolites    200 Benzodiazepine                 200 Tricyclics and metabolites     300 Opiates and metabolites        300 Cocaine and metabolites        300 THC                            50 Performed at [facility], [address], [place], [place] [address]    Alcohol, Ethyl (B) 08/29/2021 249 (H)  <10 mg/dL Final   Comment: (NOTE) Lowest detectable limit for serum alcohol is 10 mg/dL.  For medical purposes only. Performed at [facility], [address], [place], [place] [address]     Allergies: Penicillins  PTA Medications: (Not in a hospital admission)   Long Term Goals: Improvement in symptoms so as ready for discharge  Short Term Goals: Patient will verbalize feelings in meetings with treatment team members., Patient will attend at least of 50% of the groups daily., Pt will complete the PHQ9 on admission, day 3 and discharge., Patient will participate in completing the [place] Suicide Severity Rating Scale, Patient will score a low risk of violence for 24 hours prior to discharge, and Patient will take medications as prescribed daily.  Medical Decision Making  Dillyn Menna. was admitted to [facility] base crisis unit for Alcohol use disorder, severe, in early remission [facility]), crisis management, and stabilization. Routine labs reviewed Medication Management: Medications started  apixaban  5  mg Oral BID   clonazePAM  1 mg Oral QHS   Daily Vites  1 tablet Oral Daily   folic acid  1 mg Oral Daily   gabapentin  300 mg Oral TID   lactulose  20 g Oral BID   QUEtiapine  100 mg Oral QHS   sertraline  25 mg Oral Daily   thiamine  100 mg Oral Daily   Tiotropium Bromide Monohydrate  2 puff Inhalation Daily   traZODone  100 mg Oral QHS   acetaminophen, albuterol, alum & mag hydroxide-simeth, hydrOXYzine, magnesium hydroxide  Will maintain observation checks every 15 minutes for safety. Psychosocial education regarding relapse prevention and self-care; social and communication  Social work will consult with family for collateral information and discuss discharge and follow up plan.     Recommendations  Based on my evaluation the patient does not appear to have an emergency medical condition.  Lawson Isabell, NP 10/14/21  2:53 PM

## 2021-10-14 NOTE — ED Notes (Signed)
Pt sleeping@this time. breathing even and unlabored. Will continue to monitor for safety 

## 2021-10-14 NOTE — Progress Notes (Signed)
Pt is still under review at [facility]. CSW spoke with Para March who advised that pt could possibly admit tomorrow. CSW provided information to contact pt's RN. [facility] to follow back up with CSW.  Maryjean Ka, MSW, LCSWA 10/14/2021 3:06 PM

## 2021-10-14 NOTE — ED Notes (Signed)
Transport called.

## 2021-10-15 DIAGNOSIS — F1021 Alcohol dependence, in remission: Secondary | ICD-10-CM | POA: Diagnosis not present

## 2021-10-15 MED ORDER — HYDROXYZINE HCL 25 MG PO TABS
25.0000 mg | ORAL_TABLET | Freq: Four times a day (QID) | ORAL | Status: AC | PRN
Start: 2021-10-15 — End: 2021-10-18

## 2021-10-15 MED ORDER — SERTRALINE HCL 50 MG PO TABS
75.0000 mg | ORAL_TABLET | Freq: Every day | ORAL | Status: DC
  Administered 2021-10-16 – 2021-10-18 (×3): 75 mg via ORAL
  Filled 2021-10-15 (×3): qty 1

## 2021-10-15 MED ORDER — NICOTINE 21 MG/24HR TD PT24
MEDICATED_PATCH | TRANSDERMAL | Status: AC
  Administered 2021-10-15: 21 mg
  Filled 2021-10-15: qty 1

## 2021-10-15 MED ORDER — ONDANSETRON 4 MG PO TBDP
4.0000 mg | ORAL_TABLET | Freq: Four times a day (QID) | ORAL | Status: AC | PRN
Start: 2021-10-15 — End: 2021-10-18

## 2021-10-15 MED ORDER — NICOTINE 21 MG/24HR TD PT24
21.0000 mg | MEDICATED_PATCH | Freq: Every day | TRANSDERMAL | Status: DC
  Administered 2021-10-16 – 2021-10-18 (×3): 21 mg via TRANSDERMAL
  Filled 2021-10-15 (×3): qty 1

## 2021-10-15 MED ORDER — LOPERAMIDE HCL 2 MG PO CAPS: 2.0000 mg | ORAL_CAPSULE | ORAL | Status: AC | PRN

## 2021-10-15 MED ORDER — CLONAZEPAM 0.5 MG PO TABS
0.5000 mg | ORAL_TABLET | Freq: Every day | ORAL | Status: DC
  Administered 2021-10-15: 0.5 mg via ORAL
  Filled 2021-10-15: qty 1

## 2021-10-15 MED ORDER — CHLORDIAZEPOXIDE HCL 25 MG PO CAPS: 25.0000 mg | ORAL_CAPSULE | Freq: Four times a day (QID) | ORAL | Status: AC | PRN

## 2021-10-15 NOTE — ED Notes (Signed)
Pt awake and alert. He is currently in dayroom watching TV.  No distress noted.

## 2021-10-15 NOTE — ED Notes (Signed)
Pt awake and alert. Has eaten breakfast and He is currently watching TV with no distress noted. Staff will continue to monitor for safety.  

## 2021-10-15 NOTE — [facility] Group Notes (Signed)
LCSW Group Therapy Note  10/15/2021 1:30pm  Type of Therapy/Topic:  Group Therapy:  Emotion Regulation  Participation Level:  Active   Description of Group:   The purpose of this group is to assist patients in learning to regulate negative emotions and experience positive emotions. Patients will be guided to discuss ways in which they have been vulnerable to their negative emotions. These vulnerabilities will be juxtaposed with experiences of positive emotions or situations, and patients will be challenged to use positive emotions to combat negative ones. Special emphasis will be placed on coping with negative emotions in conflict situations, and patients will process healthy conflict resolution skills.  Therapeutic Goals: Patient will identify two positive emotions or experiences to reflect on in order to balance out negative emotions Patient will label two or more emotions that they find the most difficult to experience Patient will demonstrate positive conflict resolution skills through discussion and/or role plays  Summary of Patient Progress: Pt attentive throughout group, responded to CSW questions.  Pt identified depression and anxiety as emotions that are difficult for him to manage.         Therapeutic Modalities:   Cognitive Behavioral Therapy Feelings Identification Dialectical Behavioral Therapy   Lorri Frederick, LCSW 10/15/2021 2:24 PM

## 2021-10-15 NOTE — ED Notes (Signed)
Pt asleep in bed. Respirations even and unlabored. Monitoring for safety. 

## 2021-10-15 NOTE — ED Notes (Signed)
Pt attended group and watched a Tom Brady recovery video  

## 2021-10-15 NOTE — ED Notes (Signed)
Dispo Sunday AM 9/10

## 2021-10-15 NOTE — Progress Notes (Addendum)
CSW spoke with pt after being informed by MD that he is in eviction proceedings and in danger of losing access to his belongings.  Pt confirmed this, apartment is at [facility], [address], [place] [phone].  Permission given by pt for CSW to contact [facility].  Pt also confirms desire for residential substance abuse treatment.  Does have [employer].   CSW spoke with Terri, Investment banker, corporate at [employer].  Pt has eviction hearing on 9/14 at 2pm on second floor at [facility].  If he does not appear for that hearing, he will have 10 days from that date before the apartment will be padlocked.  Since pt is at the hospital, CSW can submit MD letter stating this and pt hearing will be continued for 30 more days.  Email letter to [email or web address].  MD informed.  Pt referral sent to [facility] for residential substance abuse treatment.  Pt informed of the above court date, also given contact information for [facility] treatment center, residential treatment.  Daleen Squibb, MSW, LCSW 9/7/20233:38 PM

## 2021-10-15 NOTE — ED Notes (Signed)
Pt is awake and alert. He is currently sitting in dayroom watching TV.   NO distress noted.  Pharm was informed that pt does not have his INH in his drawer.  Also this staff person could not find INH in pt belongings.

## 2021-10-15 NOTE — ED Notes (Signed)
Pt is in the bed sleeping. Respirations are even and unlabored. No acute distress noted. Will continue to monitor for safety. 

## 2021-10-15 NOTE — ED Provider Notes (Addendum)
Facility Based Crisis Behavioral Health Progress Note  Date and Time: 10/15/2021 4:27 PM Name: Dylan Duke. DOB: 08-21-1960  MRN:  [id number]  Diagnosis:  Final diagnoses:  Alcohol use disorder, severe, in early remission (HCC)  Severe episode of recurrent major depressive disorder, without psychotic features (HCC)  Suicidal ideation    Reason for presentation: IVC (Suicidal/Alcohol Problem/Hallucinations)  Brief HPI  Dylan Duke. is a 61 y.o. male, with PMH of alcohol abuse with withdrawals but no Dts, remote suicide attempts x2, and COPD who was directly admitted to facility based crisis unit from [facility] ED where he initially presented with complaints of depression, anxiety, increase in alcohol consumption.     "Per Chart Review:  NP psychiatric assessment 10/14/21 [facility] ED:  Dylan Duke was seen and evaluated face-to-face by this provider.  Continues to endorse suicidal ideations however denying plan or intent.  States struggling with depression and anxiety.  States he recently started drinking more heavily over the past 3 months.  States he was admitted to a facility in the past for substance abuse " [place]".  States he feels like his depression is when him down. Patient was initially accepted to facility based crisis center however after RN attempted to give report it was reported that patient endorsed a plan " what ever way possible."  NP spoke to patient regarding reported symptoms.  States " I forgot the plan, I just needed help with my depression."    Dylan Pel. seen face to face by this provider, consulted with Dr. Nelly Rout; and chart reviewed on 10/14/21.  On evaluation Dylan Pel. Continues to report depression related to recent relapse on alcohol.  Patient states he recently finished a detox and rehab program and over the weekend he drank a 12 pk of beer and then on another day he started drinking again 2 or 3 beers and "I was like I said I wasn't going  to do this no more.  I'm just disgusted with myself."  States he needs to get his head together and may need more rehab.  States he is currently unemployed "I lost my job when I went to detox.  States that he is currently homeless and may not have any more support "after this time I probably wont.  My dad has had enough."  At this time patient denies suicidal/self-harm/homicidal ideation, psychosis, and paranoia.  Wanting to get his head clear and set up with community services for substance use (alcohol abuse).  Denies outpatient psychiatric services During evaluation Dylan Duke. is alert/oriented x 4; calm/cooperative; and mood is congruent with affect.  He does not appear to be responding to internal/external stimuli or delusional thoughts.  Patient denies suicidal/self-harm/homicidal ideation, psychosis, and paranoia.  Patient answered question appropriately."  Interval Hx   Last 24h:   Pt received nicotine /24hr patch over night and had no behavioral concerns.   Subjective:   Pt re-evaluated this morning and reports he is feeling improved, but still depressed. He denies any active or passive SI currently, however states "they told me I have a mass in my chest so if that takes me out then so be it." He provided more history regarding his mental health and previous admissions. Per pt, he has been abusing alcohol since he was 61 y/o and has been to rehab multiple times. He typically drinks two 12-packs of beer a day. He moved to [place] from [place] and then to [place] for a sober house  that he states was named "[facility] He moved to an apartment in March but relapsed again and was placed in a detox facility near [place] ~2 months ago and during that stay had an episode where his b/l legs gave out from underneath him causing him to fall to the ground. He denies any LOC or head trauma. Pt recalls being transported to an ED in [facility] where he was found to have  b/l DVTs and "a mass in my chest." Pt state he was placed on Eliquis at that time, but has not continued this medication due to access issues. Since his relapse the patient has not paid rent at his apartment and therefore is concerned his belongings will be discarded. He had a plan to rent a U-haul and have one of his friends, Dylan Duke, from the sober house assist him with moving his things to a storage facility, however, presented to the ED on Sunday. He rented a motel room over the weekend as he was nervous to stay in his apartment since he had not paid rent for two months. Pt states he last drink was last Saturday (9/02) and has not had any cravings for alcohol since then. He reports feeling tired, but denies any shakes, headaches that are different from his occasional baseline headache, chest pain, shortness of breath, nausea, or vomiting. Of note, he has loose stools at baseline secondary to his Lactulose but denies any changes in this. Pt states he does not have a support system in place locally as all of his family is in [place]. He has considered moving back there into one of his cousins/nephews houses. Pt states "I just want to feel better and finally quit drinking alcohol after all this time." Pt recalls a remote history of suicide attempt x2 via ingesting pills when he was 61 y/o and then again when he was 61 y/o after his family cut him off for drinking. Since then the pt states he has not had any attempts at harming himself. 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 His other source of income is [employer], which he was going to use to pay for the storage facility for his things.   ROS   Mood: Dysphoric Sleep: Good Appetite: Good   SI: No HI: No AVH: None   Psych ROS   Depression: depressed mood, fatigue, impaired memory,  Hypo-/Mania:  None Anxiety:  None Psychosis:  None Trauma:  None  Past History    Psychiatric History:  Dx: MDD, alcohol use d/o & w/d (no DTs), suicide attempt with BH hospitalization (last 34 years). MDD Prior Rx: Lactulose 20g BID, Tylenol 650mg  q6 hrs PRN, albuterol BID PRN, Maalox 200-200-20 mg/52mL suspension 4mL q4 hrs PRN, Klonopin 1mg  daily at bedtime, Gabapentin 300mg  QID, Seroquel 100mg  at bedtime, Trazadone 100mg  daily at bedtime, Zoloft 25mg  qday Current OP psychiatrist: None Prior OP psychiatrist: None Current OP therapist: None Prior OP therapist PCP: None currenlty Suicide Attempt: at age 10 and 61 y/o Inpatient psych: previous admission in Violence/Aggression: None  Past Medical History: History reviewed. No pertinent past medical history. History reviewed. No pertinent surgical history.  Family Psychiatric History: See H&P  Family History: History reviewed. No pertinent family history.  Social History:   Living: In a hotel room currently Income: Social security  EtOH: Daily, typically two 12-packs of beer a day Tobacco: Yes Cannabis: No IVDU: No Opiates: No BZOs:  No Others: N/A Seizure: No Detox: Interested in detox with a history of detox/rehab in the past  Residential: Interested in residential with a hx of inpatient admission Seizure/DT: No Social History   Substance and Sexual Activity  Alcohol Use Not Currently     Social History   Substance and Sexual Activity  Drug Use Never    Social History   Socioeconomic History   Marital status: Divorced    Spouse name: Not on file   Number of children: Not on file   Years of education: Not on file   Highest education level: Not on file  Occupational History   Not on file  Tobacco Use   Smoking status: Some Days   Smokeless tobacco: Never  Vaping Use   Vaping Use: Some days  Substance and Sexual Activity   Alcohol use: Not Currently   Drug use: Never   Sexual activity: Not on file  Other Topics Concern   Not on file  Social History Narrative   **  Merged History Encounter **       Social Determinants of Health   Financial Resource Strain: Not on file  Food Insecurity: Not on file  Transportation Needs: Not on file  Physical Activity: Not on file  Stress: Not on file  Social Connections: Not on file    SDOH:  SDOH Screenings   Tobacco Use: High Risk (10/14/2021)   Additional Social History:                         Current Medications   Current Facility-Administered Medications  Medication Dose Route Frequency Provider Last Rate Last Admin   acetaminophen (TYLENOL) tablet 650 mg  650 mg Oral Q6H PRN Rankin, Shuvon B, NP       albuterol (VENTOLIN HFA) 108 (90 Base) MCG/ACT inhaler 2 puff  2 puff Inhalation BID PRN Rankin, Shuvon B, NP       alum & mag hydroxide-simeth (MAALOX/MYLANTA) 200-200-20 MG/5ML suspension 30 mL  30 mL Oral Q4H PRN Rankin, Shuvon B, NP       apixaban (ELIQUIS) tablet 5 mg  5 mg Oral BID Rankin, Shuvon B, NP   5 mg at 10/15/21 0917   chlordiazePOXIDE (LIBRIUM) capsule 25 mg  25 mg Oral Q6H PRN Princess Bruins, DO       clonazePAM Scarlette Calico) tablet 0.5 mg  0.5 mg Oral QHS Princess Bruins, DO       folic acid (FOLVITE) tablet 1 mg  1 mg Oral Daily Rankin, Shuvon B, NP   1 mg at 10/15/21 0916   gabapentin (NEURONTIN) capsule 300 mg  300 mg Oral TID Rankin, Shuvon B, NP   300 mg at 10/15/21 1542   hydrOXYzine (ATARAX) tablet 25 mg  25 mg Oral Q6H PRN Princess Bruins, DO       lactulose (CHRONULAC) 10 GM/15ML solution 20 g  20 g Oral BID Rankin, Shuvon B, NP   20 g at 10/15/21 0916   loperamide (IMODIUM) capsule 2-4 mg  2-4 mg Oral PRN Princess Bruins, DO       magnesium hydroxide (MILK OF MAGNESIA) suspension 30 mL  30 mL Oral Daily PRN Rankin, Shuvon B, NP       multivitamin with minerals tablet 1 tablet  1 tablet Oral Daily Rankin, Shuvon B, NP   1 tablet at 10/15/21 0916   nicotine (NICODERM CQ - dosed in mg/24 hours) patch 21 mg  21 mg Transdermal Daily Cyndie Chime,  Raynelle FanningJulie, DO       ondansetron (ZOFRAN-ODT)  disintegrating tablet 4 mg  4 mg Oral Q6H PRN Princess BruinsNguyen, Dariela Stoker, DO       QUEtiapine (SEROQUEL) tablet 100 mg  100 mg Oral QHS Rankin, Shuvon B, NP   100 mg at 10/14/21 2121   [START ON 10/16/2021] sertraline (ZOLOFT) tablet 75 mg  75 mg Oral Daily Princess BruinsNguyen, Milli Woolridge, DO       thiamine (VITAMIN B1) tablet 100 mg  100 mg Oral Daily Rankin, Shuvon B, NP   100 mg at 10/15/21 0917   traZODone (DESYREL) tablet 100 mg  100 mg Oral QHS Rankin, Shuvon B, NP   100 mg at 10/14/21 2121   umeclidinium bromide (INCRUSE ELLIPTA) 62.5 MCG/ACT 1 puff  1 puff Inhalation Daily Nelly RoutKumar, Archana, MD   1 puff at 10/15/21 [id number]   Current Outpatient Medications  Medication Sig Dispense Refill   albuterol (VENTOLIN HFA) 108 (90 Base) MCG/ACT inhaler Inhale 2 puffs into the lungs 2 (two) times daily as needed.     benzonatate (TESSALON) 100 MG capsule Take 1 capsule (100 mg total) by mouth every 8 (eight) hours. (Patient not taking: Reported on 10/11/2021) 15 capsule 0   chlordiazePOXIDE (LIBRIUM) 25 MG capsule 50mg  PO TID x 1D, then 25-50mg  PO BID X 1D, then 25-50mg  PO QD X 1D (Patient not taking: Reported on 10/11/2021) 10 capsule 0   clonazePAM (KLONOPIN) 1 MG tablet Take 1 mg by mouth in the morning and at bedtime.     cyclobenzaprine (FLEXERIL) 10 MG tablet Take 10 mg by mouth daily.     ELIQUIS 5 MG TABS tablet Take 5 mg by mouth 2 (two) times daily.     folic acid (FOLVITE) 1 MG tablet Take 1 mg by mouth daily.     gabapentin (NEURONTIN) 300 MG capsule Take 300 mg by mouth 3 (three) times daily.     ibuprofen (ADVIL) 400 MG tablet Take 400 mg by mouth every 6 (six) hours as needed.     lactulose (CHRONULAC) 10 GM/15ML solution Take 20 g by mouth in the morning and at bedtime.     Multiple Vitamin (DAILY VITES) tablet Take 1 tablet by mouth daily.     predniSONE (DELTASONE) 10 MG tablet Take 6 tabs day one, 5 tabs day two, 4 tabs day three, etc (Patient not taking: Reported on 10/11/2021) 21 tablet 0   QUEtiapine (SEROQUEL) 100 MG  tablet Take 100 mg by mouth at bedtime.     sertraline (ZOLOFT) 25 MG tablet Take 25 mg by mouth daily.     thiamine (VITAMIN B1) 100 MG tablet Take 100 mg by mouth daily.     Tiotropium Bromide Monohydrate 2.5 MCG/ACT AERS Inhale 2 puffs into the lungs daily.     traZODone (DESYREL) 100 MG tablet Take 100 mg by mouth at bedtime.      Labs / Images  Lab Results:  Admission on 10/11/2021, Discharged on 10/14/2021  Component Date Value Ref Range Status   Sodium 10/11/2021 133 (L)  135 - 145 mmol/L Final   Potassium 10/11/2021 4.3  3.5 - 5.1 mmol/L Final   Chloride 10/11/2021 98  98 - 111 mmol/L Final   CO2 10/11/2021 24  22 - 32 mmol/L Final   Glucose, Bld 10/11/2021 91  70 - 99 mg/dL Final   Glucose reference range applies only to samples taken after fasting for at least 8 hours.   BUN 10/11/2021 11  8 - 23 mg/dL  Final   Creatinine, Ser 10/11/2021 0.92  0.61 - 1.24 mg/dL Final   Calcium 16/11/9602 8.1 (L)  8.9 - 10.3 mg/dL Final   Total Protein 54/10/8117 6.6  6.5 - 8.1 g/dL Final   Albumin 14/78/2956 4.0  3.5 - 5.0 g/dL Final   AST 21/30/8657 31  15 - 41 U/L Final   ALT 10/11/2021 38  0 - 44 U/L Final   Alkaline Phosphatase 10/11/2021 73  38 - 126 U/L Final   Total Bilirubin 10/11/2021 0.7  0.3 - 1.2 mg/dL Final   GFR, Estimated 10/11/2021 >60  >60 mL/min Final   Comment: (NOTE) Calculated using the CKD-EPI Creatinine Equation (2021)    Anion gap 10/11/2021 11  5 - 15 Final   Performed at [facility], [address], [place], [place] [address]   Alcohol, Ethyl (B) 10/11/2021 294 (H)  <10 mg/dL Final   Comment: (NOTE) Lowest detectable limit for serum alcohol is 10 mg/dL.  For medical purposes only. Performed at [facility], [address], [place], [place] [address]    Salicylate Lvl 10/11/2021 <7.0 (L)  7.0 - 30.0 mg/dL Final   Performed at [facility], [address] [place], [place] [address]   Acetaminophen  (Tylenol), Serum 10/11/2021 <10 (L)  10 - 30 ug/mL Final   Comment: (NOTE) Therapeutic concentrations vary significantly. A range of 10-30 ug/mL  may be an effective concentration for many patients. However, some  are best treated at concentrations outside of this range. Acetaminophen concentrations >150 ug/mL at 4 hours after ingestion  and >50 ug/mL at 12 hours after ingestion are often associated with  toxic reactions.  Performed at [facility], [address], [place], [place] [address]    WBC 10/11/2021 5.2  4.0 - 10.5 K/uL Final   RBC 10/11/2021 4.71  4.22 - 5.81 MIL/uL Final   Hemoglobin 10/11/2021 14.6  13.0 - 17.0 g/dL Final   HCT 72/53/6644 43.9  39.0 - 52.0 % Final   MCV 10/11/2021 93.2  80.0 - 100.0 fL Final   MCH 10/11/2021 31.0  26.0 - 34.0 pg Final   MCHC 10/11/2021 33.3  30.0 - 36.0 g/dL Final   RDW 03/47/4259 13.2  11.5 - 15.5 % Final   Platelets 10/11/2021 193  150 - 400 K/uL Final   nRBC 10/11/2021 0.0  0.0 - 0.2 % Final   Performed at [facility], [address], [place], [place] [address]   SARS Coronavirus 2 by RT PCR 10/11/2021 NEGATIVE  NEGATIVE Final   Comment: (NOTE) SARS-CoV-2 target nucleic acids are NOT DETECTED.  The SARS-CoV-2 RNA is generally detectable in upper respiratory specimens during the acute phase of infection. The lowest concentration of SARS-CoV-2 viral copies this assay can detect is 138 copies/mL. A negative result does not preclude SARS-Cov-2 infection and should not be used as the sole basis for treatment or other patient management decisions. A negative result may occur with  improper specimen collection/handling, submission of specimen other than nasopharyngeal swab, presence of viral mutation(s) within the areas targeted by this assay, and inadequate number of viral copies(<138 copies/mL). A negative result must be combined with clinical observations, patient history, and  epidemiological information. The expected result is Negative.  Fact Sheet for Patients:  BloggerCourse.com  Fact Sheet for Healthcare Providers:  SeriousBroker.it  This test is no  t yet approved or cleared by the [place] and  has been authorized for detection and/or diagnosis of SARS-CoV-2 by FDA under an Emergency Use Authorization (EUA). This EUA will remain  in effect (meaning this test can be used) for the duration of the COVID-19 declaration under Section 564(b)(1) of the Act, 21 U.S.C.section 360bbb-3(b)(1), unless the authorization is terminated  or revoked sooner.       Influenza A by PCR 10/11/2021 NEGATIVE  NEGATIVE Final   Influenza B by PCR 10/11/2021 NEGATIVE  NEGATIVE Final   Comment: (NOTE) The Xpert Xpress SARS-CoV-2/FLU/RSV plus assay is intended as an aid in the diagnosis of influenza from Nasopharyngeal swab specimens and should not be used as a sole basis for treatment. Nasal washings and aspirates are unacceptable for Xpert Xpress SARS-CoV-2/FLU/RSV testing.  Fact Sheet for Patients: BloggerCourse.com  Fact Sheet for Healthcare Providers: SeriousBroker.it  This test is not yet approved or cleared by the [place] FDA and has been authorized for detection and/or diagnosis of SARS-CoV-2 by FDA under an Emergency Use Authorization (EUA). This EUA will remain in effect (meaning this test can be used) for the duration of the COVID-19 declaration under Section 564(b)(1) of the Act, 21 U.S.C. section 360bbb-3(b)(1), unless the authorization is terminated or revoked.  Performed at [facility], [address] [place], [place] [address]    Opiates 10/11/2021 NONE DETECTED  NONE DETECTED Final   Cocaine 10/11/2021 NONE DETECTED  NONE DETECTED Final   Benzodiazepines 10/11/2021 NONE DETECTED  NONE  DETECTED Final   Amphetamines 10/11/2021 NONE DETECTED  NONE DETECTED Final   Tetrahydrocannabinol 10/11/2021 NONE DETECTED  NONE DETECTED Final   Barbiturates 10/11/2021 NONE DETECTED  NONE DETECTED Final   Comment: (NOTE) DRUG SCREEN FOR MEDICAL PURPOSES ONLY.  IF CONFIRMATION IS NEEDED FOR ANY PURPOSE, NOTIFY LAB WITHIN 5 DAYS.  LOWEST DETECTABLE LIMITS FOR URINE DRUG SCREEN Drug Class                     Cutoff (ng/mL) Amphetamine and metabolites    1000 Barbiturate and metabolites    200 Benzodiazepine                 200 Tricyclics and metabolites     300 Opiates and metabolites        300 Cocaine and metabolites        300 THC                            50 Performed at [facility], [address], [place], [place] [address]    Color, Urine 10/11/2021 COLORLESS (A)  YELLOW Final   APPearance 10/11/2021 CLEAR  CLEAR Final   Specific Gravity, Urine 10/11/2021 1.004 (L)  1.005 - 1.030 Final   pH 10/11/2021 6.0  5.0 - 8.0 Final   Glucose, UA 10/11/2021 NEGATIVE  NEGATIVE mg/dL Final   Hgb urine dipstick 10/11/2021 NEGATIVE  NEGATIVE Final   Bilirubin Urine 10/11/2021 NEGATIVE  NEGATIVE Final   Ketones, ur 10/11/2021 NEGATIVE  NEGATIVE mg/dL Final   Protein, ur 09/81/1914 NEGATIVE  NEGATIVE mg/dL Final   Nitrite 78/29/5621 NEGATIVE  NEGATIVE Final   Leukocytes,Ua 10/11/2021 NEGATIVE  NEGATIVE Final   Performed at [facility], [address], [place], [place] [address]  Admission on 08/29/2021, Discharged on 08/29/2021  Component Date Value Ref Range Status   Lipase 08/29/2021 33  11 - 51  U/L Final   Performed at [facility], [address], [place], [place] [address]   Sodium 08/29/2021 137  135 - 145 mmol/L Final   Potassium 08/29/2021 3.9  3.5 - 5.1 mmol/L Final   Chloride 08/29/2021 100  98 - 111 mmol/L Final   CO2 08/29/2021 20 (L)  22 - 32 mmol/L Final   Glucose, Bld 08/29/2021 78  70 - 99 mg/dL Final   Glucose reference  range applies only to samples taken after fasting for at least 8 hours.   BUN 08/29/2021 8  8 - 23 mg/dL Final   Creatinine, Ser 08/29/2021 0.81  0.61 - 1.24 mg/dL Final   Calcium 17/51/0258 8.7 (L)  8.9 - 10.3 mg/dL Final   Total Protein 52/77/8242 6.8  6.5 - 8.1 g/dL Final   Albumin 35/36/1443 4.4  3.5 - 5.0 g/dL Final   AST 15/40/0867 100 (H)  15 - 41 U/L Final   ALT 08/29/2021 61 (H)  0 - 44 U/L Final   Alkaline Phosphatase 08/29/2021 72  38 - 126 U/L Final   Total Bilirubin 08/29/2021 0.6  0.3 - 1.2 mg/dL Final   GFR, Estimated 08/29/2021 >60  >60 mL/min Final   Comment: (NOTE) Calculated using the CKD-EPI Creatinine Equation (2021)    Anion gap 08/29/2021 17 (H)  5 - 15 Final   Performed at [facility], [address], [place], [place] [address]   WBC 08/29/2021 5.3  4.0 - 10.5 K/uL Final   RBC 08/29/2021 4.69  4.22 - 5.81 MIL/uL Final   Hemoglobin 08/29/2021 14.9  13.0 - 17.0 g/dL Final   HCT 93/26/7124 42.2  39.0 - 52.0 % Final   MCV 08/29/2021 90.0  80.0 - 100.0 fL Final   MCH 08/29/2021 31.8  26.0 - 34.0 pg Final   MCHC 08/29/2021 35.3  30.0 - 36.0 g/dL Final   RDW 58/10/9831 13.2  11.5 - 15.5 % Final   Platelets 08/29/2021 145 (L)  150 - 400 K/uL Final   nRBC 08/29/2021 0.0  0.0 - 0.2 % Final   Performed at [facility], [address], [place], [place] [address]   Color, Urine 08/29/2021 STRAW (A)  YELLOW Final   APPearance 08/29/2021 CLEAR  CLEAR Final   Specific Gravity, Urine 08/29/2021 1.003 (L)  1.005 - 1.030 Final   pH 08/29/2021 6.0  5.0 - 8.0 Final   Glucose, UA 08/29/2021 NEGATIVE  NEGATIVE mg/dL Final   Hgb urine dipstick 08/29/2021 NEGATIVE  NEGATIVE Final   Bilirubin Urine 08/29/2021 NEGATIVE  NEGATIVE Final   Ketones, ur 08/29/2021 NEGATIVE  NEGATIVE mg/dL Final   Protein, ur 39/76/7341 NEGATIVE  NEGATIVE mg/dL Final   Nitrite 93/79/0240 NEGATIVE  NEGATIVE Final   Leukocytes,Ua 08/29/2021 NEGATIVE  NEGATIVE Final   Performed at [facility], [address] [place], [place] [address]   Opiates 08/29/2021 NONE DETECTED  NONE DETECTED Final   Cocaine 08/29/2021 NONE DETECTED  NONE DETECTED Final   Benzodiazepines 08/29/2021 POSITIVE (A)  NONE DETECTED Final   Amphetamines 08/29/2021 NONE DETECTED  NONE DETECTED Final   Tetrahydrocannabinol 08/29/2021 NONE DETECTED  NONE DETECTED Final   Barbiturates 08/29/2021 NONE DETECTED  NONE DETECTED Final   Comment: (NOTE) DRUG SCREEN FOR MEDICAL PURPOSES ONLY.  IF CONFIRMATION IS NEEDED FOR ANY PURPOSE, NOTIFY LAB WITHIN 5 DAYS.  LOWEST DETECTABLE LIMITS FOR URINE DRUG SCREEN Drug Class  Cutoff (ng/mL) Amphetamine and metabolites    1000 Barbiturate and metabolites    200 Benzodiazepine                 200 Tricyclics and metabolites     300 Opiates and metabolites        300 Cocaine and metabolites        300 THC                            50 Performed at [facility], [address] [place], [place] [address]    Alcohol, Ethyl (B) 08/29/2021 249 (H)  <10 mg/dL Final   Comment: (NOTE) Lowest detectable limit for serum alcohol is 10 mg/dL.  For medical purposes only. Performed at [facility], [address] [place], [place] [address]    Blood Alcohol level:  Lab Results  Component Value Date   ETH 294 (H) 10/11/2021   ETH 249 (H) 08/29/2021   Metabolic Disorder Labs: No results found for: "HGBA1C", "MPG" No results found for: "PROLACTIN" No results found for: "CHOL", "TRIG", "HDL", "CHOLHDL", "VLDL", "LDLCALC" Therapeutic Lab Levels: No results found for: "LITHIUM" No results found for: "VALPROATE" No results found for: "CBMZ" Physical Findings   Flowsheet Row ED from 10/14/2021 in [facility] ED from 10/11/2021 in [facility] from 08/29/2021 in [facility] EMERGENCY DEPARTMENT  C-SSRS RISK CATEGORY No Risk Moderate Risk Moderate Risk         Musculoskeletal  Strength & Muscle Tone: within normal limits Gait & Station: normal Patient leans: N/A    Psychiatric Specialty Exam  Presentation  General Appearance: Appropriate for Environment  Eye Contact:Good  Speech:Clear and Coherent; Normal Rate  Speech Volume:Normal  Handedness:Right   Mood and Affect  Mood:Dysphoric  Affect:Congruent   Thought Process  Thought Processes:Coherent; Goal Directed  Descriptions of Associations:Intact   Thought Content Suicidal Thoughts:Suicidal Thoughts: No  Homicidal Thoughts:Homicidal Thoughts: No  Hallucinations:Hallucinations: None  Ideas of Reference:None  Thought Content:Logical   Sensorium  Memory:Immediate Good; Recent Good  Judgment:Fair  Insight:Present   Executive Functions  Orientation:Full (Time, Place and Person)  Language:Good  Concentration:Good  Attention:Good  Recall:Good  Fund of Knowledge:Good   Psychomotor Activity  Psychomotor Activity:Psychomotor Activity: Normal   Assets  Assets:Communication Skills; Desire for Improvement; Financial Resources/Insurance; Social Support   Sleep  Sleep:Sleep: Good   Nutritional Assessment (For OBS and [facility]BC admissions only) Has the patient had a weight loss or gain of 10 pounds or more in the last 3 months?: No Has the patient had a decrease in food intake/or appetite?: No Does the patient have dental problems?: No Does the patient have eating habits or behaviors that may be indicators of an eating disorder including binging or inducing vomiting?: No Has the patient recently lost weight without trying?: 0 Has the patient been eating poorly because of a decreased appetite?: 0 Malnutrition Screening Tool Score: 0    Physical Exam  BP 117/79 (BP Location: Left Arm)   Pulse 95   Temp 98.7 F (37.1 C) (Oral)   Resp 19   SpO2 94%  Physical Exam Constitutional:      General: He is not in acute distress.    Appearance: Normal  appearance.  HENT:     Head: Normocephalic and atraumatic.  Pulmonary:     Effort: Pulmonary effort is normal.  Neurological:  Mental Status: He is alert.      Assessment / Plan  Dylan Duke. is a 61 y.o. male, with PMH of alcohol use d/o (No h/o seizure or DT), remote suicide attempts x2, and COPD who was directly admitted to facility based crisis unit from [facility] ED where he initially presented with complaints of depression, anxiety, increase in alcohol consumption. Last drink 9/2. BAL 294, UDS + bzo  Patient was admitted to [facility] 08/30/2021 for falls, from [facility]. 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 Presented to ED 8/8 for chest pain, negative ACS, found to have lung nodule. During August 2023, patient was at 30 day residential program for AUD, completed and returned home ([place] apartment on 9/2). Patient relapsed on EtOH, drinking 6 pack beer x1 that day. Then presented to [facility] 9/3 with fleeting SI and requesting residential rehab. Patient was evicted from apartment and requested that he be dc home on Sunday 9/10 to pack his apartment to place them in storage unit, in preparation for residential rehab.   Currently under IVC, will rescind due to patient amenable to rehab and is not acute suicidal, homicidal, psychotic. Not a harm to self or others.  AUD/chronic benzo use -CIWA with PRN Librium per protocol with thiamine, MV, and folic acid.  -Plan is to taper off Klonopin to to help with residential placement.  His dose will be reduced to 0.5mg  tonight and then 0.25mg  tomorrow, with a steady decrease until he is tapered off.  -He will continue his home Gabapentin 300mg  TID -dispo to home then residential   Substance abuse mood disorder Home Zoloft 50mg  will be increased to 75mg  qday.  He will continue home Seroquel 100mg  qHS and Trazadone 100mg  QHS at night to help with sleep.   COPD Stable, plan to  continue umeclidinium bromide 1 puff daily as directed. Will follow up outpatient.   B/l DVTs Stable, Eliquis was restarted 5mg  BID. Will follow up outpatient   Treatment Plan Summary: Daily contact with patient to assess and evaluate symptoms and progress in treatment and Medication management  Total Time spent with patient: 30 minutes  Signed: , Student-PA 10/15/2021, 4:27 PM   [facility] [address] [place], [place] Dept: [phone] Dept Fax: [phone]  ________________________________________________________  I was present for the entirety of the evaluation on 10/15/2021. I reviewed the patient's chart, and I participated in key portions of the service. I discussed the case with the student, and I agree with the assessment and plan of care as documented in the medical student's note. I discussed patient with attending.  [place], DO  PGY2

## 2021-10-15 NOTE — ED Notes (Signed)
Pt is in the dayroom watching TV.  Respirations are even and unlabored. No acute distress noted. Will continue to monitor for safety. 

## 2021-10-16 ENCOUNTER — Encounter (HOSPITAL_COMMUNITY): Payer: Self-pay

## 2021-10-16 LAB — LIPID PANEL
Cholesterol: 191 mg/dL (ref 0–200)
HDL: 42 mg/dL (ref >40)
LDL Cholesterol: UNDETERMINED mg/dL (ref 0–99)
Total CHOL/HDL Ratio: 4.5 RATIO
Triglycerides: 430 mg/dL — ABNORMAL HIGH (ref <150)
VLDL: UNDETERMINED mg/dL (ref 0–40)

## 2021-10-16 LAB — LDL CHOLESTEROL, DIRECT: Direct LDL: 128 mg/dL — ABNORMAL HIGH (ref 0–99)

## 2021-10-16 LAB — TSH: TSH: 2.346 u[IU]/mL (ref 0.350–4.500)

## 2021-10-16 MED ORDER — DISULFIRAM 250 MG PO TABS
250.0000 mg | ORAL_TABLET | Freq: Every day | ORAL | Status: DC
  Administered 2021-10-17 – 2021-10-18 (×2): 250 mg via ORAL
  Filled 2021-10-16 (×2): qty 1

## 2021-10-16 MED ORDER — TRAZODONE HCL 50 MG PO TABS
50.0000 mg | ORAL_TABLET | Freq: Every evening | ORAL | Status: DC | PRN
Start: 2021-10-16 — End: 2021-10-18
  Administered 2021-10-16 – 2021-10-17 (×2): 50 mg via ORAL
  Filled 2021-10-16: qty 1

## 2021-10-16 MED ORDER — CLONAZEPAM 0.25 MG PO TBDP
0.2500 mg | ORAL_TABLET | Freq: Every day | ORAL | Status: DC
  Administered 2021-10-16: 0.25 mg via ORAL
  Filled 2021-10-16: qty 1

## 2021-10-16 NOTE — [facility] Group Notes (Signed)
LCSW Wellness Group Note  10/16/2021 2:00pm  Type of Group and Topic: Psychoeducational Group:  Wellness  Participation Level:  Active  Description of Group  Wellness group introduces the topic and its focus on developing healthy habits across the spectrum and its relationship to a decrease in hospital admissions.  Six areas of wellness are discussed: physical, social spiritual, intellectual, occupational, and emotional.  Patients are asked to consider their current wellness habits and to identify areas of wellness where they are interested and able to focus on improvements.    Therapeutic Goals Patients will understand components of wellness and how they can positively impact overall health.  Patients will identify areas of wellness where they have developed good habits. Patients will identify areas of wellness where they would like to make improvements.    Summary of Patient Progress: pt had limited participation today, did respond to CSW questions, was attentive to the group discussion.  Pt identified social and emotional as wellness areas that needed work.     Therapeutic Modalities: Cognitive Behavioral Therapy Psychoeducation    Lorri Frederick, LCSW

## 2021-10-16 NOTE — ED Notes (Signed)
Pt sitting in dining room watching TV. A&O x4, calm and cooperative. Denies SI/HI/AVH. No signs of distress noted. Monitoring for safety. 

## 2021-10-16 NOTE — ED Notes (Signed)
Patient is presently asleep in bed without issue or complaint.  Will monitor.

## 2021-10-16 NOTE — BH IP Treatment Plan (Signed)
Interdisciplinary Treatment and Diagnostic Plan Update  10/16/2021 Time of Session: [address] MRN: [id number]  Diagnosis:  Final diagnoses:  Alcohol use disorder, severe, in early remission (HCC)  Severe episode of recurrent major depressive disorder, without psychotic features (HCC)  Suicidal ideation     Current Medications:  Current Facility-Administered Medications  Medication Dose Route Frequency Provider Last Rate Last Admin   acetaminophen (TYLENOL) tablet 650 mg  650 mg Oral Q6H PRN Rankin, Shuvon B, NP       albuterol (VENTOLIN HFA) 108 (90 Base) MCG/ACT inhaler 2 puff  2 puff Inhalation BID PRN Rankin, Shuvon B, NP       alum & mag hydroxide-simeth (MAALOX/MYLANTA) 200-200-20 MG/5ML suspension 30 mL  30 mL Oral Q4H PRN Rankin, Shuvon B, NP       apixaban (ELIQUIS) tablet 5 mg  5 mg Oral BID Rankin, Shuvon B, NP   5 mg at 10/16/21 0905   chlordiazePOXIDE (LIBRIUM) capsule 25 mg  25 mg Oral Q6H PRN Princess Bruins, DO       clonazePAM Scarlette Cal[place]) tablet 0.25 mg  0.25 mg Oral QHS Princess Bruins, DO       folic acid (FOLVITE) tablet 1 mg  1 mg Oral Daily Rankin, Shuvon B, NP   1 mg at 10/16/21 0905   gabapentin (NEURONTIN) capsule 300 mg  300 mg Oral TID Rankin, Shuvon B, NP   300 mg at 10/16/21 0905   hydrOXYzine (ATARAX) tablet 25 mg  25 mg Oral Q6H PRN Princess Bruins, DO       lactulose (CHRONULAC) 10 GM/15ML solution 20 g  20 g Oral BID Rankin, Shuvon B, NP   20 g at 10/15/21 2203   loperamide (IMODIUM) capsule 2-4 mg  2-4 mg Oral PRN Princess Bruins, DO       magnesium hydroxide (MILK OF MAGNESIA) suspension 30 mL  30 mL Oral Daily PRN Rankin, Shuvon B, NP       multivitamin with minerals tablet 1 tablet  1 tablet Oral Daily Rankin, Shuvon B, NP   1 tablet at 10/16/21 0904   nicotine (NICODERM CQ - dosed in mg/24 hours) patch 21 mg  21 mg Transdermal Daily Princess Bruins, DO   21 mg at 10/16/21 0904   ondansetron (ZOFRAN-ODT) disintegrating tablet 4 mg  4 mg Oral Q6H  PRN Princess Bruins, DO       QUEtiapine (SEROQUEL) tablet 100 mg  100 mg Oral QHS Rankin, Shuvon B, NP   100 mg at 10/15/21 2203   sertraline (ZOLOFT) tablet 75 mg  75 mg Oral Daily Princess Bruins, DO   75 mg at 10/16/21 0962   thiamine (VITAMIN B1) tablet 100 mg  100 mg Oral Daily Rankin, Shuvon B, NP   100 mg at 10/16/21 0905   traZODone (DESYREL) tablet 100 mg  100 mg Oral QHS Rankin, Shuvon B, NP   100 mg at 10/15/21 2203   umeclidinium bromide (INCRUSE ELLIPTA) 62.5 MCG/ACT 1 puff  1 puff Inhalation Daily Nelly Rout, MD   1 puff at 10/16/21 0903   Current Outpatient Medications  Medication Sig Dispense Refill   albuterol (VENTOLIN HFA) 108 (90 Base) MCG/ACT inhaler Inhale 2 puffs into the lungs 2 (two) times daily as needed.     benzonatate (TESSALON) 100 MG capsule Take 1 capsule (100 mg total) by mouth every 8 (eight) hours. (Patient not taking: Reported on 10/11/2021) 15 capsule 0   chlordiazePOXIDE (LIBRIUM) 25 MG capsule 50mg  PO TID x  1D, then 25-50mg  PO BID X 1D, then 25-50mg  PO QD X 1D (Patient not taking: Reported on 10/11/2021) 10 capsule 0   clonazePAM (KLONOPIN) 1 MG tablet Take 1 mg by mouth in the morning and at bedtime.     cyclobenzaprine (FLEXERIL) 10 MG tablet Take 10 mg by mouth daily.     ELIQUIS 5 MG TABS tablet Take 5 mg by mouth 2 (two) times daily.     folic acid (FOLVITE) 1 MG tablet Take 1 mg by mouth daily.     gabapentin (NEURONTIN) 300 MG capsule Take 300 mg by mouth 3 (three) times daily.     ibuprofen (ADVIL) 400 MG tablet Take 400 mg by mouth every 6 (six) hours as needed.     lactulose (CHRONULAC) 10 GM/15ML solution Take 20 g by mouth in the morning and at bedtime.     Multiple Vitamin (DAILY VITES) tablet Take 1 tablet by mouth daily.     predniSONE (DELTASONE) 10 MG tablet Take 6 tabs day one, 5 tabs day two, 4 tabs day three, etc (Patient not taking: Reported on 10/11/2021) 21 tablet 0   QUEtiapine (SEROQUEL) 100 MG tablet Take 100 mg by mouth at bedtime.      sertraline (ZOLOFT) 25 MG tablet Take 25 mg by mouth daily.     thiamine (VITAMIN B1) 100 MG tablet Take 100 mg by mouth daily.     Tiotropium Bromide Monohydrate 2.5 MCG/ACT AERS Inhale 2 puffs into the lungs daily.     traZODone (DESYREL) 100 MG tablet Take 100 mg by mouth at bedtime.     PTA Medications: Prior to Admission medications   Medication Sig Start Date End Date Taking? Authorizing Provider  albuterol (VENTOLIN HFA) 108 (90 Base) MCG/ACT inhaler Inhale 2 puffs into the lungs 2 (two) times daily as needed. 09/16/21   [provider]  benzonatate (TESSALON) 100 MG capsule Take 1 capsule (100 mg total) by mouth every 8 (eight) hours. Patient not taking: Reported on 10/11/2021 12/10/20   Renne Crigler, PA-C  chlordiazePOXIDE (LIBRIUM) 25 MG capsule 50mg  PO TID x 1D, then 25-50mg  PO BID X 1D, then 25-50mg  PO QD X 1D Patient not taking: Reported on 10/11/2021 08/29/21   08/31/21, MD  clonazePAM (KLONOPIN) 1 MG tablet Take 1 mg by mouth in the morning and at bedtime.    [provider]  cyclobenzaprine (FLEXERIL) 10 MG tablet Take 10 mg by mouth daily.    [provider]  ELIQUIS 5 MG TABS tablet Take 5 mg by mouth 2 (two) times daily. 10/05/21   [provider]  folic acid (FOLVITE) 1 MG tablet Take 1 mg by mouth daily. 09/09/21   [provider]  gabapentin (NEURONTIN) 300 MG capsule Take 300 mg by mouth 3 (three) times daily. 09/29/21   [provider]  ibuprofen (ADVIL) 400 MG tablet Take 400 mg by mouth every 6 (six) hours as needed.    [provider]  lactulose (CHRONULAC) 10 GM/15ML solution Take 20 g by mouth in the morning and at bedtime. 09/28/21   [provider]  Multiple Vitamin (DAILY VITES) tablet Take 1 tablet by mouth daily. 10/09/21   [provider]  predniSONE (DELTASONE) 10 MG tablet Take 6 tabs day one, 5 tabs day two, 4 tabs day three, etc Patient not taking: Reported on 10/11/2021 03/15/20    05/13/20, PA-C  QUEtiapine (SEROQUEL) 100 MG tablet Take 100 mg by mouth at bedtime.  10/06/21   [provider]  sertraline (ZOLOFT) 25 MG tablet Take 25 mg by mouth daily. 09/22/21   [provider]  thiamine (VITAMIN B1) 100 MG tablet Take 100 mg by mouth daily. 09/09/21   [provider]  Tiotropium Bromide Monohydrate 2.5 MCG/ACT AERS Inhale 2 puffs into the lungs daily. 03/26/20   [provider]  traZODone (DESYREL) 100 MG tablet Take 100 mg by mouth at bedtime. 09/29/21   [provider]    Patient Stressors: Financial difficulties   Substance abuse    Patient Strengths: Motivation for treatment/growth   Treatment Modalities: Medication Management, Group therapy, Case management,  1 to 1 session with clinician, Psychoeducation, Recreational therapy.   Physician Treatment Plan for Primary and Secondary Diagnosis:  Final diagnoses:  Alcohol use disorder, severe, in early remission ([place])  Severe episode of recurrent major depressive disorder, without psychotic features ([place])  Suicidal ideation   Long Term Goal(s): Improvement in symptoms so as ready for discharge  Short Term Goals: Patient will verbalize feelings in meetings with treatment team members. Patient will attend at least of 50% of the groups daily. Pt will complete the PHQ9 on admission, day 3 and discharge. Patient will participate in completing the [place] Patient will score a low risk of violence for 24 hours prior to discharge Patient will take medications as prescribed daily.  Medication Management: Evaluate patient's response, side effects, and tolerance of medication regimen.  Therapeutic Interventions: 1 to 1 sessions, Unit Group sessions and Medication administration.  Evaluation of Outcomes: Progressing  LCSW Treatment Plan for Primary Diagnosis:  Final diagnoses:  Alcohol use disorder, severe, in early remission ([place])   Severe episode of recurrent major depressive disorder, without psychotic features ([place])  Suicidal ideation    Long Term Goal(s): Safe transition to appropriate next level of care at discharge.  Short Term Goals: Identify minimum of 2 triggers associated with mental health/substance abuse issues with treatment team members. and Increase skills for wellness and recovery by attending 50% of scheduled groups.  Therapeutic Interventions: Assess for all discharge needs, 1 to 1 time with Education officer, museum, Explore available resources and support systems, Assess for adequacy in community support network, Educate family and significant other(s) on suicide prevention, Complete Psychosocial Assessment, Interpersonal group therapy.  Evaluation of Outcomes: Progressing   Progress in Treatment: Attending groups: Yes. Participating in groups: Yes. Taking medication as prescribed: Yes. Toleration medication: Yes. Family/Significant other contact made: No, will contact:    Patient understands diagnosis: Yes. Discussing patient identified problems/goals with staff: Yes. Medical problems stabilized or resolved: Yes. Denies suicidal/homicidal ideation: Yes. Issues/concerns per patient self-inventory: No. Other: none  New problem(s) identified: No, Describe:  none  New Short Term/Long Term Goal(s):  Patient Goals:  residential substance abuse treatment  Discharge Plan or Barriers: [facility] residential treatment  Reason for Continuation of Hospitalization: Medication stabilization  Estimated Length of Stay:1-3 days  Last 3 [place] Suicide Severity Risk Score: [place] ED from 10/14/2021 in [facility] ED from 10/11/2021 in [place] [facility] ED from 08/29/2021 in [place] No Risk Moderate Risk Moderate Risk       Last PHQ 2/9 Scores:     No data to display          Scribe  for Treatment Team: Deirdre Evener 10/16/2021 10:42 AM

## 2021-10-16 NOTE — ED Provider Notes (Signed)
Facility Based Crisis Behavioral Health Progress Note  Date and Time: 10/16/2021 4:41 PM Name: Dylan Duke. DOB: Apr 18, 1960  MRN:  [id number]  Diagnosis:  Final diagnoses:  Alcohol use disorder, severe, in early remission (HCC)  Severe episode of recurrent major depressive disorder, without psychotic features (HCC)  Suicidal ideation    Reason for presentation: IVC (Suicidal/Alcohol Problem/Hallucinations)  Brief HPI  Dylan Duke. is a 61 y.o. male, with PMH of alcohol abuse without hx of seizures or Dts, remote suicide attempts x2, and COPD who was directly admitted to facility based crisis unit from [facility] ED where he initially presented with complaints of depression, anxiety, increase in alcohol consumption. Pt originally reported passive SI without a plan or intent upon presentation, but has been denying that any SI recently. He reported drinking heavily over the last three months, noting he finished a detox and rehab program and over the weekend he drank a 12 pk of beer and then on another day he started drinking again 2 or 3 beers and "I was like I said I wasn't going to do this no more.  I'm just disgusted with myself" (per chart review). He typically drinks two 12 packs of beer a day.    9/07- Further history was obtained. Per pt, he has been abusing alcohol since he was 61 y/o and has been to rehab multiple times. He typically drinks two 12-packs of beer a day. He moved to [place] from [place] and then to [place] for a sober house that he states was named "[facility] He moved to an apartment in March but relapsed again and was placed in a detox facility near [place] ~1 months ago and during that stay had an episode where his b/l legs gave out from underneath him causing him to fall to the ground. He denies any LOC or head trauma. Pt recalls being transported to an ED in [facility] where he was found to have b/l DVTs and "a mass in my chest." Pt state  he was placed on Eliquis at that time, but has not continued this medication due to access issues. Since his relapse the patient has not paid rent at his apartment and therefore is concerned his belongings will be discarded. He had a plan to rent a U-haul and have one of his friends, Dylan Duke, from the sober house assist him with moving his things to a storage facility, however, presented to the ED on Sunday for depression, anxiety, and alcohol consumption. He rented a motel room over the weekend as he was nervous to stay in his apartment since he had not paid rent for two months. Pt states he last drink was last Saturday (9/02) and has not had any cravings for alcohol since then. He reports feeling tired, but denies any shakes, headaches that are different from his occasional baseline headache, chest pain, shortness of breath, nausea, or vomiting. Of note, he has loose stools at baseline secondary to his Lactulose but denies any changes in this. Pt states he does not have a support system in place locally as all of his family is in [place]. He has considered moving back there into one of his cousins/nephews houses. Pt states "I just want to feel better and finally quit drinking alcohol after all this time." Pt recalls a remote history of suicide attempt x2 via ingesting pills when he was 61 y/o and then again when he was 61 y/o after his family cut him off for drinking.  Since then the pt states he has not had any attempts at harming himself. 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 His other source of income is [employer], which he was going to use to pay for the storage facility for his things.   He denied any active or passive SI currently, however states "they told me I have a mass in my chest so if that takes me out then so be it." Per chart review, pt was found to have a 5mm pulmonary nodule in the right lung base without any  previous imaging to compare to. It was recommended to have a follow CT scan in 6-12 months since he is high risk because of smoking.    Interval Hx   Last 24h:   Pt received his overnight medications including Eliquis , Klonopin 0.5mg , Trazadone , Seroquel , and Gabapentin  at 2200. Unremarkable vital signs including BP 123/80 (sitting), HR 84, RR 17, and afebrile. No behavioral concerns were reported.   Subjective:    Pt re-evaluated this morning and reports feeling anxious regarding locating his belongings, particularly his wallet. Pt reports if his wallet is found then his is agreeable to stay here until he is discharged. He denies any active or passive SI, HI, or AVH. He notes he does not want to take his Lactulose secondary to the loose stools it causes and therefore has been refusing it since yesterday. Pt reports "they just told me it was for my liver and that's all I know." "I don't know how long I am supposed to take it or what is even wrong with my liver."   9/03 --> CMP: NA 133, Calcium 8.1, otherwise unremarkable  9/03 --> CBC unremarkable 9/03 --> BAL 294 9/03 --> UDS positive for cocaine and marijuana  9/07 --> EKG performed   ROS   Mood: Depressed Sleep: Fair Appetite: Good   SI: No HI: No AVH: None   Psych ROS   Depression: depressed mood, fatigue, impaired memory, positive anhedonia, feelings of guilt, denied hopelessness, decreased energy and concentration, good appetite, denied SI  Hypo-/Mania:  Screened negative . Denies decreased need for sleep with increased energy, goal directed activity, risky behaviors, excessive spending Anxiety:  Denied difficulties managing stress or worry . Does ruminate. Psychosis:  Denied AVH Trauma:  Denied  Past History   Psychiatric History:  Dx: MDD, alcohol use d/o & w/d (no DTs), suicide attempt with [facility] hospitalization (last 34 years). MDD Prior Rx: Lactulose 20g BID, Tylenol  q6 hrs PRN,  albuterol BID PRN, Maalox 200-200-20 mg/35mL suspension 30mL q4 hrs PRN, Klonopin  daily at bedtime, Gabapentin  QID, Seroquel  at bedtime, Trazadone  daily at bedtime, Zoloft  qday Current OP psychiatrist: None Prior OP psychiatrist: None Current OP therapist: None Prior OP therapist PCP: None currenlty Suicide Attempt: at age 6 and 61 y/o Inpatient psych: previous admission in [place] Violence/Aggression: None  Past Medical History: History reviewed. No pertinent past medical history. History reviewed. No pertinent surgical history.  Family Psychiatric History: Denied past psych family hx  Family History: History reviewed. No pertinent family history.  Social History:   Living: In a hotel room currently Income: Social security  EtOH: Daily, typically two 12-packs of beer a day Tobacco: Yes Cannabis: No IVDU: No Opiates: No BZOs: No Others: N/A Seizure: No Detox: Interested in detox with a history of detox/rehab in the past  Residential: Interested in  residential with a hx of inpatient admission Seizure/DT: No Social History   Substance and Sexual Activity  Alcohol Use Not Currently     Social History   Substance and Sexual Activity  Drug Use Never    Social History   Socioeconomic History   Marital status: Divorced    Spouse name: Not on file   Number of children: Not on file   Years of education: Not on file   Highest education level: Not on file  Occupational History   Not on file  Tobacco Use   Smoking status: Some Days   Smokeless tobacco: Never  Vaping Use   Vaping Use: Some days  Substance and Sexual Activity   Alcohol use: Not Currently   Drug use: Never   Sexual activity: Not on file  Other Topics Concern   Not on file  Social History Narrative   ** Merged History Encounter **       Social Determinants of Health   Financial Resource Strain: Not on file  Food Insecurity: Not on file  Transportation Needs: Not  on file  Physical Activity: Not on file  Stress: Not on file  Social Connections: Not on file    SDOH:  SDOH Screenings   Tobacco Use: High Risk (10/14/2021)   Additional Social History:                         Current Medications   Current Facility-Administered Medications  Medication Dose Route Frequency Provider Last Rate Last Admin   acetaminophen (TYLENOL) tablet 650 mg  650 mg Oral Q6H PRN Rankin, Shuvon B, NP   650 mg at 10/16/21 1349   albuterol (VENTOLIN HFA) 108 (90 Base) MCG/ACT inhaler 2 puff  2 puff Inhalation BID PRN Rankin, Shuvon B, NP       alum & mag hydroxide-simeth (MAALOX/MYLANTA) 200-200-20 MG/5ML suspension 30 mL  30 mL Oral Q4H PRN Rankin, Shuvon B, NP       apixaban (ELIQUIS) tablet 5 mg  5 mg Oral BID Rankin, Shuvon B, NP   5 mg at 10/16/21 0905   chlordiazePOXIDE (LIBRIUM) capsule 25 mg  25 mg Oral Q6H PRN Princess Bruins, DO       clonazePAM (KLONOPIN) disintegrating tablet 0.25 mg  0.25 mg Oral QHS Princess Bruins, DO       [START ON 10/17/2021] disulfiram (ANTABUSE) tablet 250 mg  250 mg Oral Daily Princess Bruins, DO       folic acid (FOLVITE) tablet 1 mg  1 mg Oral Daily Rankin, Shuvon B, NP   1 mg at 10/16/21 0905   gabapentin (NEURONTIN) capsule 300 mg  300 mg Oral TID Rankin, Shuvon B, NP   300 mg at 10/16/21 1531   hydrOXYzine (ATARAX) tablet 25 mg  25 mg Oral Q6H PRN Princess Bruins, DO       lactulose (CHRONULAC) 10 GM/15ML solution 20 g  20 g Oral BID Rankin, Shuvon B, NP   20 g at 10/15/21 2203   loperamide (IMODIUM) capsule 2-4 mg  2-4 mg Oral PRN Princess Bruins, DO       multivitamin with minerals tablet 1 tablet  1 tablet Oral Daily Rankin, Shuvon B, NP   1 tablet at 10/16/21 0904   nicotine (NICODERM CQ - dosed in mg/24 hours) patch 21 mg  21 mg Transdermal Daily Princess Bruins, DO   21 mg at 10/16/21 0904   ondansetron (ZOFRAN-ODT) disintegrating tablet 4 mg  4  mg Oral Q6H PRN Princess BruinsNguyen, Shalandria Elsbernd, DO       QUEtiapine (SEROQUEL) tablet 100 mg  100 mg  Oral QHS Rankin, Shuvon B, NP   100 mg at 10/15/21 2203   sertraline (ZOLOFT) tablet 75 mg  75 mg Oral Daily Princess Bruinsguyen, Jerron Niblack, DO   75 mg at 10/16/21 [id number]   thiamine (VITAMIN B1) tablet 100 mg  100 mg Oral Daily Rankin, Shuvon B, NP   100 mg at 10/16/21 0905   traZODone (DESYREL) tablet 50 mg  50 mg Oral QHS PRN Princess BruinsNguyen, Yordy Matton, DO       umeclidinium bromide (INCRUSE ELLIPTA) 62.5 MCG/ACT 1 puff  1 puff Inhalation Daily Nelly RoutKumar, Archana, MD   1 puff at 10/16/21 0903   Current Outpatient Medications  Medication Sig Dispense Refill   albuterol (VENTOLIN HFA) 108 (90 Base) MCG/ACT inhaler Inhale 2 puffs into the lungs 2 (two) times daily as needed.     benzonatate (TESSALON) 100 MG capsule Take 1 capsule (100 mg total) by mouth every 8 (eight) hours. (Patient not taking: Reported on 10/11/2021) 15 capsule 0   chlordiazePOXIDE (LIBRIUM) 25 MG capsule 50mg  PO TID x 1D, then 25-50mg  PO BID X 1D, then 25-50mg  PO QD X 1D (Patient not taking: Reported on 10/11/2021) 10 capsule 0   clonazePAM (KLONOPIN) 1 MG tablet Take 1 mg by mouth in the morning and at bedtime.     cyclobenzaprine (FLEXERIL) 10 MG tablet Take 10 mg by mouth daily.     ELIQUIS 5 MG TABS tablet Take 5 mg by mouth 2 (two) times daily.     folic acid (FOLVITE) 1 MG tablet Take 1 mg by mouth daily.     gabapentin (NEURONTIN) 300 MG capsule Take 300 mg by mouth 3 (three) times daily.     ibuprofen (ADVIL) 400 MG tablet Take 400 mg by mouth every 6 (six) hours as needed.     lactulose (CHRONULAC) 10 GM/15ML solution Take 20 g by mouth in the morning and at bedtime.     Multiple Vitamin (DAILY VITES) tablet Take 1 tablet by mouth daily.     predniSONE (DELTASONE) 10 MG tablet Take 6 tabs day one, 5 tabs day two, 4 tabs day three, etc (Patient not taking: Reported on 10/11/2021) 21 tablet 0   QUEtiapine (SEROQUEL) 100 MG tablet Take 100 mg by mouth at bedtime.     sertraline (ZOLOFT) 25 MG tablet Take 25 mg by mouth daily.     thiamine (VITAMIN B1) 100 MG  tablet Take 100 mg by mouth daily.     Tiotropium Bromide Monohydrate 2.5 MCG/ACT AERS Inhale 2 puffs into the lungs daily.     traZODone (DESYREL) 100 MG tablet Take 100 mg by mouth at bedtime.      Labs / Images  Lab Results:  Admission on 10/14/2021  Component Date Value Ref Range Status   TSH 10/16/2021 2.346  0.350 - 4.500 uIU/mL Final   Comment: Performed by a 3rd Generation assay with a functional sensitivity of <=0.01 uIU/mL. Performed at [facility], [address] [place], [place] [address]    Cholesterol 10/16/2021 191  0 - 200 mg/dL Final   Triglycerides 40/98/119109/09/2021 430 (H)  <150 mg/dL Final   HDL 47/82/956209/09/2021 42  >40 mg/dL Final   Total CHOL/HDL Ratio 10/16/2021 4.5  RATIO Final   VLDL 10/16/2021 UNABLE TO CALCULATE IF TRIGLYCERIDE OVER 400 mg/dL  0 - 40 mg/dL Final   LDL Cholesterol 10/16/2021 UNABLE TO  CALCULATE IF TRIGLYCERIDE OVER 400 mg/dL  0 - 99 mg/dL Final   Comment:        Total Cholesterol/HDL:CHD Risk Coronary Heart Disease Risk Table                     Men   Women  1/2 Average Risk   3.4   3.3  Average Risk       5.0   4.4  2 X Average Risk   9.6   7.1  3 X Average Risk  23.4   11.0        Use the calculated Patient Ratio above and the CHD Risk Table to determine the patient's CHD Risk.        ATP III CLASSIFICATION (LDL):  <100     mg/dL   Optimal  086-578  mg/dL   Near or Above                    Optimal  130-159  mg/dL   Borderline  469-629  mg/dL   High  >528     mg/dL   Very High Performed at [facility], [address], [place], [place] [address]   Admission on 10/11/2021, Discharged on 10/14/2021  Component Date Value Ref Range Status   Sodium 10/11/2021 133 (L)  135 - 145 mmol/L Final   Potassium 10/11/2021 4.3  3.5 - 5.1 mmol/L Final   Chloride 10/11/2021 98  98 - 111 mmol/L Final   CO2 10/11/2021 24  22 - 32 mmol/L Final   Glucose, Bld 10/11/2021 91  70 - 99 mg/dL Final   Glucose reference range applies only to samples  taken after fasting for at least 8 hours.   BUN 10/11/2021 11  8 - 23 mg/dL Final   Creatinine, Ser 10/11/2021 0.92  0.61 - 1.24 mg/dL Final   Calcium 40/11/2723 8.1 (L)  8.9 - 10.3 mg/dL Final   Total Protein 36/64/4034 6.6  6.5 - 8.1 g/dL Final   Albumin 74/25/9563 4.0  3.5 - 5.0 g/dL Final   AST 87/56/4332 31  15 - 41 U/L Final   ALT 10/11/2021 38  0 - 44 U/L Final   Alkaline Phosphatase 10/11/2021 73  38 - 126 U/L Final   Total Bilirubin 10/11/2021 0.7  0.3 - 1.2 mg/dL Final   GFR, Estimated 10/11/2021 >60  >60 mL/min Final   Comment: (NOTE) Calculated using the CKD-EPI Creatinine Equation (2021)    Anion gap 10/11/2021 11  5 - 15 Final   Performed at [facility], [address], [place], [place] [address]   Alcohol, Ethyl (B) 10/11/2021 294 (H)  <10 mg/dL Final   Comment: (NOTE) Lowest detectable limit for serum alcohol is 10 mg/dL.  For medical purposes only. Performed at [facility], [address] [place] [address] [place], [place] [address]    Salicylate Lvl 10/11/2021 <7.0 (L)  7.0 - 30.0 mg/dL Final   Performed at [facility], [address], [place], [place] [address]   Acetaminophen (Tylenol), Serum 10/11/2021 <10 (L)  10 - 30 ug/mL Final   Comment: (NOTE) Therapeutic concentrations vary significantly. A range of 10-30 ug/mL  may be an effective concentration for many patients. However, some  are best treated at concentrations outside of this range. Acetaminophen concentrations >150 ug/mL at 4 hours after ingestion  and >50 ug/mL at 12 hours after ingestion are often associated with  toxic reactions.  Performed at  [facility], [address], [place], [place] [address]    WBC 10/11/2021 5.2  4.0 - 10.5 K/uL Final   RBC 10/11/2021 4.71  4.22 - 5.81 MIL/uL Final   Hemoglobin 10/11/2021 14.6  13.0 - 17.0 g/dL Final   HCT 32/44/0102 43.9  39.0 - 52.0 % Final   MCV 10/11/2021 93.2  80.0 - 100.0 fL Final    MCH 10/11/2021 31.0  26.0 - 34.0 pg Final   MCHC 10/11/2021 33.3  30.0 - 36.0 g/dL Final   RDW 72/53/6644 13.2  11.5 - 15.5 % Final   Platelets 10/11/2021 193  150 - 400 K/uL Final   nRBC 10/11/2021 0.0  0.0 - 0.2 % Final   Performed at [facility], [address], [place], [place] [address]   SARS Coronavirus 2 by RT PCR 10/11/2021 NEGATIVE  NEGATIVE Final   Comment: (NOTE) SARS-CoV-2 target nucleic acids are NOT DETECTED.  The SARS-CoV-2 RNA is generally detectable in upper respiratory specimens during the acute phase of infection. The lowest concentration of SARS-CoV-2 viral copies this assay can detect is 138 copies/mL. A negative result does not preclude SARS-Cov-2 infection and should not be used as the sole basis for treatment or other patient management decisions. A negative result may occur with  improper specimen collection/handling, submission of specimen other than nasopharyngeal swab, presence of viral mutation(s) within the areas targeted by this assay, and inadequate number of viral copies(<138 copies/mL). A negative result must be combined with clinical observations, patient history, and epidemiological information. The expected result is Negative.  Fact Sheet for Patients:  BloggerCourse.com  Fact Sheet for Healthcare Providers:  SeriousBroker.it  This test is no                          t yet approved or cleared by the [place] FDA and  has been authorized for detection and/or diagnosis of SARS-CoV-2 by FDA under an Emergency Use Authorization (EUA). This EUA will remain  in effect (meaning this test can be used) for the duration of the COVID-19 declaration under Section 564(b)(1) of the Act, 21 U.S.C.section 360bbb-3(b)(1), unless the authorization is terminated  or revoked sooner.       Influenza A by PCR 10/11/2021 NEGATIVE  NEGATIVE Final   Influenza B by PCR 10/11/2021 NEGATIVE   NEGATIVE Final   Comment: (NOTE) The Xpert Xpress SARS-CoV-2/FLU/RSV plus assay is intended as an aid in the diagnosis of influenza from Nasopharyngeal swab specimens and should not be used as a sole basis for treatment. Nasal washings and aspirates are unacceptable for Xpert Xpress SARS-CoV-2/FLU/RSV testing.  Fact Sheet for Patients: BloggerCourse.com  Fact Sheet for Healthcare Providers: SeriousBroker.it  This test is not yet approved or cleared by the [place] FDA and has been authorized for detection and/or diagnosis of SARS-CoV-2 by FDA under an Emergency Use Authorization (EUA). This EUA will remain in effect (meaning this test can be used) for the duration of the COVID-19 declaration under Section 564(b)(1) of the Act, 21 U.S.C. section 360bbb-3(b)(1), unless the authorization is terminated or revoked.  Performed at [facility], [address], [place], [place] [address]    Opiates 10/11/2021 NONE DETECTED  NONE DETECTED Final   Cocaine 10/11/2021 NONE DETECTED  NONE DETECTED Final   Benzodiazepines 10/11/2021 NONE DETECTED  NONE DETECTED Final   Amphetamines 10/11/2021 NONE DETECTED  NONE DETECTED Final   Tetrahydrocannabinol 10/11/2021 NONE DETECTED  NONE  DETECTED Final   Barbiturates 10/11/2021 NONE DETECTED  NONE DETECTED Final   Comment: (NOTE) DRUG SCREEN FOR MEDICAL PURPOSES ONLY.  IF CONFIRMATION IS NEEDED FOR ANY PURPOSE, NOTIFY LAB WITHIN 5 DAYS.  LOWEST DETECTABLE LIMITS FOR URINE DRUG SCREEN Drug Class                     Cutoff (ng/mL) Amphetamine and metabolites    1000 Barbiturate and metabolites    200 Benzodiazepine                 200 Tricyclics and metabolites     300 Opiates and metabolites        300 Cocaine and metabolites        300 THC                            50 Performed at [facility], [address], [place], [place] [address]    Color,  Urine 10/11/2021 COLORLESS (A)  YELLOW Final   APPearance 10/11/2021 CLEAR  CLEAR Final   Specific Gravity, Urine 10/11/2021 1.004 (L)  1.005 - 1.030 Final   pH 10/11/2021 6.0  5.0 - 8.0 Final   Glucose, UA 10/11/2021 NEGATIVE  NEGATIVE mg/dL Final   Hgb urine dipstick 10/11/2021 NEGATIVE  NEGATIVE Final   Bilirubin Urine 10/11/2021 NEGATIVE  NEGATIVE Final   Ketones, ur 10/11/2021 NEGATIVE  NEGATIVE mg/dL Final   Protein, ur 41/93/7902 NEGATIVE  NEGATIVE mg/dL Final   Nitrite 40/97/3532 NEGATIVE  NEGATIVE Final   Leukocytes,Ua 10/11/2021 NEGATIVE  NEGATIVE Final   Performed at [facility], [address] [place], [place] [address]  Admission on 08/29/2021, Discharged on 08/29/2021  Component Date Value Ref Range Status   Lipase 08/29/2021 33  11 - 51 U/L Final   Performed at [facility], [address], [place], [place] [address]   Sodium 08/29/2021 137  135 - 145 mmol/L Final   Potassium 08/29/2021 3.9  3.5 - 5.1 mmol/L Final   Chloride 08/29/2021 100  98 - 111 mmol/L Final   CO2 08/29/2021 20 (L)  22 - 32 mmol/L Final   Glucose, Bld 08/29/2021 78  70 - 99 mg/dL Final   Glucose reference range applies only to samples taken after fasting for at least 8 hours.   BUN 08/29/2021 8  8 - 23 mg/dL Final   Creatinine, Ser 08/29/2021 0.81  0.61 - 1.24 mg/dL Final   Calcium 96/22/2979 8.7 (L)  8.9 - 10.3 mg/dL Final   Total Protein 89/21/1941 6.8  6.5 - 8.1 g/dL Final   Albumin 74/09/1446 4.4  3.5 - 5.0 g/dL Final   AST 18/56/3149 100 (H)  15 - 41 U/L Final   ALT 08/29/2021 61 (H)  0 - 44 U/L Final   Alkaline Phosphatase 08/29/2021 72  38 - 126 U/L Final   Total Bilirubin 08/29/2021 0.6  0.3 - 1.2 mg/dL Final   GFR, Estimated 08/29/2021 >60  >60 mL/min Final   Comment: (NOTE) Calculated using the CKD-EPI Creatinine Equation (2021)    Anion gap 08/29/2021 17 (H)  5 - 15 Final   Performed at [facility], [address], [place], [place] [address]   WBC  08/29/2021 5.3  4.0 - 10.5 K/uL Final   RBC 08/29/2021 4.69  4.22 - 5.81 MIL/uL Final   Hemoglobin 08/29/2021 14.9  13.0 - 17.0 g/dL Final   HCT  08/29/2021 42.2  39.0 - 52.0 % Final   MCV 08/29/2021 90.0  80.0 - 100.0 fL Final   MCH 08/29/2021 31.8  26.0 - 34.0 pg Final   MCHC 08/29/2021 35.3  30.0 - 36.0 g/dL Final   RDW 16/11/9602 13.2  11.5 - 15.5 % Final   Platelets 08/29/2021 145 (L)  150 - 400 K/uL Final   nRBC 08/29/2021 0.0  0.0 - 0.2 % Final   Performed at [facility], [address] [place], [place] [address]   Color, Urine 08/29/2021 STRAW (A)  YELLOW Final   APPearance 08/29/2021 CLEAR  CLEAR Final   Specific Gravity, Urine 08/29/2021 1.003 (L)  1.005 - 1.030 Final   pH 08/29/2021 6.0  5.0 - 8.0 Final   Glucose, UA 08/29/2021 NEGATIVE  NEGATIVE mg/dL Final   Hgb urine dipstick 08/29/2021 NEGATIVE  NEGATIVE Final   Bilirubin Urine 08/29/2021 NEGATIVE  NEGATIVE Final   Ketones, ur 08/29/2021 NEGATIVE  NEGATIVE mg/dL Final   Protein, ur 11/91/4782 NEGATIVE  NEGATIVE mg/dL Final   Nitrite 95/62/1308 NEGATIVE  NEGATIVE Final   Leukocytes,Ua 08/29/2021 NEGATIVE  NEGATIVE Final   Performed at [facility], [address], [place], [place] [address]   Opiates 08/29/2021 NONE DETECTED  NONE DETECTED Final   Cocaine 08/29/2021 NONE DETECTED  NONE DETECTED Final   Benzodiazepines 08/29/2021 POSITIVE (A)  NONE DETECTED Final   Amphetamines 08/29/2021 NONE DETECTED  NONE DETECTED Final   Tetrahydrocannabinol 08/29/2021 NONE DETECTED  NONE DETECTED Final   Barbiturates 08/29/2021 NONE DETECTED  NONE DETECTED Final   Comment: (NOTE) DRUG SCREEN FOR MEDICAL PURPOSES ONLY.  IF CONFIRMATION IS NEEDED FOR ANY PURPOSE, NOTIFY LAB WITHIN 5 DAYS.  LOWEST DETECTABLE LIMITS FOR URINE DRUG SCREEN Drug Class                     Cutoff (ng/mL) Amphetamine and metabolites    1000 Barbiturate and metabolites    200 Benzodiazepine                 200 Tricyclics and metabolites      300 Opiates and metabolites        300 Cocaine and metabolites        300 THC                            50 Performed at [facility], [address] [place], [place] [address]    Alcohol, Ethyl (B) 08/29/2021 249 (H)  <10 mg/dL Final   Comment: (NOTE) Lowest detectable limit for serum alcohol is 10 mg/dL.  For medical purposes only. Performed at [facility], [address] [place], [place] [address]    Blood Alcohol level:  Lab Results  Component Value Date   ETH 294 (H) 10/11/2021   ETH 249 (H) 08/29/2021   Metabolic Disorder Labs: No results found for: "HGBA1C", "MPG" No results found for: "PROLACTIN" Lab Results  Component Value Date   CHOL 191 10/16/2021   TRIG 430 (H) 10/16/2021   HDL 42 10/16/2021   CHOLHDL 4.5 10/16/2021   VLDL UNABLE TO CALCULATE IF TRIGLYCERIDE OVER 400 mg/dL 32/44/0102   LDLCALC UNABLE TO CALCULATE IF TRIGLYCERIDE OVER 400 mg/dL 72/53/6644   Therapeutic Lab Levels: No results found for: "LITHIUM" No results found for: "VALPROATE" No results found for: "CBMZ" Physical Findings   Flowsheet Row ED from 10/14/2021 in [facility] ED from 10/11/2021 in [facility] from 08/29/2021 in [facility] EMERGENCY DEPARTMENT  C-SSRS RISK CATEGORY No Risk Moderate Risk Moderate Risk        Musculoskeletal  Strength & Muscle Tone: within normal limits Gait & Station: normal Patient leans: N/A    Psychiatric Specialty Exam  Presentation  General Appearance: Casual; Fairly Groomed; Appropriate for Environment  Eye Contact:Fair  Speech:Clear and Coherent; Normal Rate  Speech Volume:Normal  Handedness:Right   Mood and Affect  Mood:Depressed  Affect:Congruent; Appropriate; Constricted   Thought Process  Thought Processes:Goal Directed; Coherent; Linear  Descriptions of Associations:Intact   Thought Content Suicidal Thoughts:Suicidal Thoughts:  No  Homicidal Thoughts:Homicidal Thoughts: No  Hallucinations:Hallucinations: None  Ideas of Reference:None  Thought Content:WDL; Logical   Sensorium  Memory:Immediate Good  Judgment:Fair  Insight:Fair   Executive Functions  Orientation:Full (Time, Place and Person)  Language:Good  Concentration:Good  Attention:Good  Recall:Good  Fund of Knowledge:Good   Psychomotor Activity  Psychomotor Activity:Psychomotor Activity: Normal   Assets  Assets:Communication Skills; Desire for Improvement; Resilience   Sleep  Sleep:Sleep: Fair    Physical Exam  BP 123/80   Pulse 84   Temp 98.6 F (37 C) (Oral)   Resp 17   SpO2 94%  Physical Exam Constitutional:      General: He is not in acute distress.    Appearance: Normal appearance.  HENT:     Head: Normocephalic and atraumatic.  Pulmonary:     Effort: Pulmonary effort is normal.  Neurological:     Mental Status: He is alert.      Assessment / Plan  Dylan Duke. is a 61 y.o. male, with PMH of alcohol use d/o (No h/o seizure or DT), remote suicide attempts x2, and COPD who was directly admitted under IVC (by EDP due to SI in the setting of intoxication) to facility based crisis unit from [facility] ED where he initially presented with complaints of depression, anxiety, increase in alcohol consumption. Last drink 9/2. BAL 294, UDS + bzo. IVC was rescinded due to pt not meeting criteria. No SI, HI, AVH.   Patient was admitted to [facility] 08/30/2021 for falls, from [facility]. 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 Vascular at that time recommended 6 months of anticoagulation and then follow up with repeat ultrasound outpatient. Presented to ED 8/8 for chest pain, negative ACS, found to have lung nodule. During August 2023, patient was at 30 day residential program for AUD, completed and returned home ([place] apartment on 9/2). Patient relapsed on EtOH,  drinking 6 pack beer x1 that day. Then presented to [facility] 9/3 with fleeting SI and requesting residential rehab. Patient was evicted from apartment and requested that he be dc home on Sunday 9/10 to pack his apartment to place them in storage unit, in preparation for residential rehab. Plan is to discharge patient home on Sunday, 9/10 to allow him to gather his belongings and place them in storage. The original plan was to find placement at [facility], however, they are not taking residents until two weeks from now. So social work is working on options for temporary housing, like sober living of [place] or a Horticulturist, commercial, until [facility] is able to take him. Offered CDIOP, SIOP however the pt does not have access to a computer/phone or Internet at the moment.   9/03 --> CMP: NA 133, Calcium 8.1, otherwise unremarkable  9/03 -->  CBC unremarkable 9/03 ---> Glucose was 91, previously 78 on 7/22 therefore no Hgb A1c needed 9/03 --> BAL 294 9/03 --> UDS positive for cocaine and marijuana  9/07 --> EKG performed  9/08--> Will order TSH and lipid panel today as there is not one on record.  AUD/chronic benzo use -CIWA with PRN Librium per protocol with thiamine, MV, and folic acid. -Plan is to taper off Klonopin to to help with residential placement.  His dose was reduced to 0.5mg  last night and then his last dose will be 0.25mg  tonight. -He will continue his home Gabapentin 300mg  TID - Pt is agreeable to starting Antabuse 250mg  qday to help with his AUD. The order will be placed today to start tomorrow.   Will d/c Milk of Magnesia as a PRN med due to starting antabuse.   Substance abuse mood disorder Home Zoloft 50mg  was be increased to 75mg  qday.  He will continue home Seroquel 100mg  qHS to help with sleep and his Trazadone 100mg  was decreased to 50mg  PRN.   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 Possibly sleep aide. Holding consider decreasing or titrating off, however  deferring now d/t other med changes and detox symptoms.   COPD Stable, plan to continue umeclidinium bromide 1 puff daily as directed. Will follow up outpatient.   B/l DVTs Stable, Eliquis was restarted 5mg  BID. Will follow up outpatient   Treatment Plan Summary: Daily contact with patient to assess and evaluate symptoms and progress in treatment and Medication management  Total Time spent with patient: 30 minutes  Signed: , Student-PA 10/16/2021, 4:41 PM   [facility] [address] [place],  Dept: [phone] Dept Fax: [phone]   _______________________________________________ I was present for the entirety of the evaluation on 10/16/2021. I reviewed the patient's chart, and I participated in key portions of the service. I discussed the case with the medical student, and I agree with the assessment and plan of care as documented in the student's note.   [facility], DO

## 2021-10-16 NOTE — ED Notes (Signed)
Pt is in the bed sleeping. Respirations are even and unlabored. No acute distress noted. Will continue to monitor for safety. 

## 2021-10-16 NOTE — ED Notes (Signed)
Dinner was given 

## 2021-10-16 NOTE — ED Notes (Signed)
The group was about clean and sober with Michael Kecton 

## 2021-10-16 NOTE — Progress Notes (Addendum)
CSW confirmed with [facility] admissions that they received pt referral.  Pt needs to complete phone interview with [facility] as next step.  CSW spoke with pt, informed him of this, provided phone number.  Pt verbalized understanding. Daleen Squibb, MSW, LCSW 9/8/202311:11 AM   1445: per pt, [facility] told him they cannot take [employer].  CSW spoke with Cathlean Cower at [facility] who reports they can accept [place], they do not have a firm date to reopen after their move, but they are hoping within 1-2 weeks.  Pt can call [facility] for phone interview.  CSW spoke with pt, pt states he cannot wait 1-2 weeks for [facility].  He is considering going back to [place] at this point. Daleen Squibb, MSW, LCSW 9/8/20233:00 PM

## 2021-10-17 MED ORDER — NICOTINE 21 MG/24HR TD PT24: 21.0000 mg | MEDICATED_PATCH | Freq: Every day | TRANSDERMAL | 0 refills | Status: AC

## 2021-10-17 MED ORDER — TRAZODONE HCL 50 MG PO TABS: 50.0000 mg | ORAL_TABLET | Freq: Every evening | ORAL | 0 refills | Status: AC | PRN

## 2021-10-17 MED ORDER — UMECLIDINIUM BROMIDE 62.5 MCG/ACT IN AEPB: 1.0000 | INHALATION_SPRAY | Freq: Every day | RESPIRATORY_TRACT | 0 refills | Status: AC

## 2021-10-17 MED ORDER — DISULFIRAM 250 MG PO TABS: 250.0000 mg | ORAL_TABLET | Freq: Every day | ORAL | 0 refills | Status: AC

## 2021-10-17 MED ORDER — SERTRALINE HCL 50 MG PO TABS: 75.0000 mg | ORAL_TABLET | Freq: Every day | ORAL | 0 refills | Status: AC

## 2021-10-17 NOTE — ED Notes (Signed)
Pt asleep in bed. Respirations even and unlabored. Monitoring for safety. 

## 2021-10-17 NOTE — ED Provider Notes (Signed)
Behavioral Health Progress Note  Date and Time: 10/17/2021 11:50 AM Name: Dylan Duke. MRN:  [id number]  Subjective:   Dylan Duke is a 61 yr old male who presented to [facility] on 9/3 wanting Detox, he was admitted to [facility] on 9/6.  PPHx is significant for Depression and EtOH Abuse, 2 Suicide Attempts (OD), 4 hospitalizations, and multiple Rehab Admissions.  He reports that he is doing well today.  He reports he slept well last night.  He reports his appetite is doing good.  He reports no SI, HI, or AVH.  He reports.  He reports having a he reports that he does not want to take the lactulose that.  He reports his plan is still to discharge tomorrow so that he can stay this.  He reports he has been at [facility] but it is not open for another 2 weeks and plans to call [facility] daily to see if they have a bed there first.  He reports no other concerns at present.   Diagnosis:  Final diagnoses:  Alcohol use disorder, severe, in early remission (HCC)  Severe episode of recurrent major depressive disorder, without psychotic features (HCC)  Suicidal ideation    Total Time spent with patient: 15 minutes  Past Psychiatric History: Depression and EtOH Abuse, 2 Suicide Attempts (OD), 4 hospitalizations, and multiple Rehab Admissions.  Past Medical History: History reviewed. No pertinent past medical history. History reviewed. No pertinent surgical history. Family History: History reviewed. No pertinent family history. Family Psychiatric  History: Reports No Known Social History:  Social History   Substance and Sexual Activity  Alcohol Use Not Currently     Social History   Substance and Sexual Activity  Drug Use Never    Social History   Socioeconomic History   Marital status: Divorced    Spouse name: Not on file   Number of children: Not on file   Years of education: Not on file   Highest education level: Not on file  Occupational History   Not on file  Tobacco Use   Smoking  status: Some Days   Smokeless tobacco: Never  Vaping Use   Vaping Use: Some days  Substance and Sexual Activity   Alcohol use: Not Currently   Drug use: Never   Sexual activity: Not on file  Other Topics Concern   Not on file  Social History Narrative   ** Merged History Encounter **       Social Determinants of Health   Financial Resource Strain: Not on file  Food Insecurity: Not on file  Transportation Needs: Not on file  Physical Activity: Not on file  Stress: Not on file  Social Connections: Not on file   SDOH:  SDOH Screenings   Depression (PHQ2-9): Medium Risk (10/17/2021)  Tobacco Use: High Risk (10/14/2021)   Additional Social History:                         Sleep: Good  Appetite:  Good  Current Medications:  Current Facility-Administered Medications  Medication Dose Route Frequency Provider Last Rate Last Admin   acetaminophen (TYLENOL) tablet 650 mg  650 mg Oral Q6H PRN Rankin, Shuvon B, NP   650 mg at 10/16/21 1349   albuterol (VENTOLIN HFA) 108 (90 Base) MCG/ACT inhaler 2 puff  2 puff Inhalation BID PRN Rankin, Shuvon B, NP       alum & mag hydroxide-simeth (MAALOX/MYLANTA) 200-200-20 MG/5ML suspension 30 mL  30 mL  Oral Q4H PRN Rankin, Shuvon B, NP       apixaban (ELIQUIS) tablet 5 mg  5 mg Oral BID Rankin, Shuvon B, NP   5 mg at 10/17/21 0914   chlordiazePOXIDE (LIBRIUM) capsule 25 mg  25 mg Oral Q6H PRN Princess Bruins, DO       clonazePAM Scarlette Calico) disintegrating tablet 0.25 mg  0.25 mg Oral QHS Princess Bruins, DO   0.25 mg at 10/16/21 2157   disulfiram (ANTABUSE) tablet 250 mg  250 mg Oral Daily Princess Bruins, DO   250 mg at 10/17/21 4481   folic acid (FOLVITE) tablet 1 mg  1 mg Oral Daily Rankin, Shuvon B, NP   1 mg at 10/17/21 0914   gabapentin (NEURONTIN) capsule 300 mg  300 mg Oral TID Rankin, Shuvon B, NP   300 mg at 10/17/21 0914   hydrOXYzine (ATARAX) tablet 25 mg  25 mg Oral Q6H PRN Princess Bruins, DO       lactulose (CHRONULAC) 10 GM/15ML  solution 20 g  20 g Oral BID Rankin, Shuvon B, NP   20 g at 10/15/21 2203   loperamide (IMODIUM) capsule 2-4 mg  2-4 mg Oral PRN Princess Bruins, DO       multivitamin with minerals tablet 1 tablet  1 tablet Oral Daily Rankin, Shuvon B, NP   1 tablet at 10/17/21 0915   nicotine (NICODERM CQ - dosed in mg/24 hours) patch 21 mg  21 mg Transdermal Daily Princess Bruins, DO   21 mg at 10/17/21 0916   ondansetron (ZOFRAN-ODT) disintegrating tablet 4 mg  4 mg Oral Q6H PRN Princess Bruins, DO       QUEtiapine (SEROQUEL) tablet 100 mg  100 mg Oral QHS Rankin, Shuvon B, NP   100 mg at 10/16/21 2157   sertraline (ZOLOFT) tablet 75 mg  75 mg Oral Daily Princess Bruins, DO   75 mg at 10/17/21 8563   thiamine (VITAMIN B1) tablet 100 mg  100 mg Oral Daily Rankin, Shuvon B, NP   100 mg at 10/17/21 0914   traZODone (DESYREL) tablet 50 mg  50 mg Oral QHS PRN Princess Bruins, DO   50 mg at 10/16/21 2158   umeclidinium bromide (INCRUSE ELLIPTA) 62.5 MCG/ACT 1 puff  1 puff Inhalation Daily Nelly Rout, MD   1 puff at 10/17/21 0725   Current Outpatient Medications  Medication Sig Dispense Refill   albuterol (VENTOLIN HFA) 108 (90 Base) MCG/ACT inhaler Inhale 2 puffs into the lungs 2 (two) times daily as needed.     [START ON 10/18/2021] disulfiram (ANTABUSE) 250 MG tablet Take 1 tablet (250 mg total) by mouth daily. 30 tablet 0   ELIQUIS 5 MG TABS tablet Take 5 mg by mouth 2 (two) times daily.     folic acid (FOLVITE) 1 MG tablet Take 1 mg by mouth daily.     gabapentin (NEURONTIN) 300 MG capsule Take 300 mg by mouth 3 (three) times daily.     lactulose (CHRONULAC) 10 GM/15ML solution Take 20 g by mouth in the morning and at bedtime.     Multiple Vitamin (DAILY VITES) tablet Take 1 tablet by mouth daily.     [START ON 10/18/2021] nicotine (NICODERM CQ - DOSED IN MG/24 HOURS) 21 mg/24hr patch Place 1 patch (21 mg total) onto the skin daily. 28 patch 0   QUEtiapine (SEROQUEL) 100 MG tablet Take 100 mg by mouth at bedtime.      sertraline (ZOLOFT) 50 MG tablet Take 1.5 tablets (  75 mg total) by mouth daily. 45 tablet 0   thiamine (VITAMIN B1) 100 MG tablet Take 100 mg by mouth daily.     Tiotropium Bromide Monohydrate 2.5 MCG/ACT AERS Inhale 2 puffs into the lungs daily.     traZODone (DESYREL) 50 MG tablet Take 1 tablet (50 mg total) by mouth at bedtime as needed for sleep. 30 tablet 0   [START ON 10/18/2021] umeclidinium bromide (INCRUSE ELLIPTA) 62.5 MCG/ACT AEPB Inhale 1 puff into the lungs daily. 30 each 0    Labs  Lab Results:  Admission on 10/14/2021  Component Date Value Ref Range Status   TSH 10/16/2021 2.346  0.350 - 4.500 uIU/mL Final   Comment: Performed by a 3rd Generation assay with a functional sensitivity of <=0.01 uIU/mL. Performed at [facility], [address] [place], [place] [address]    Cholesterol 10/16/2021 191  0 - 200 mg/dL Final   Triglycerides 60/45/409809/09/2021 430 (H)  <150 mg/dL Final   HDL 11/91/478209/09/2021 42  >40 mg/dL Final   Total CHOL/HDL Ratio 10/16/2021 4.5  RATIO Final   VLDL 10/16/2021 UNABLE TO CALCULATE IF TRIGLYCERIDE OVER 400 mg/dL  0 - 40 mg/dL Final   LDL Cholesterol 10/16/2021 UNABLE TO CALCULATE IF TRIGLYCERIDE OVER 400 mg/dL  0 - 99 mg/dL Final   Comment:        Total Cholesterol/HDL:CHD Risk Coronary Heart Disease Risk Table                     Men   Women  1/2 Average Risk   3.4   3.3  Average Risk       5.0   4.4  2 X Average Risk   9.6   7.1  3 X Average Risk  23.4   11.0        Use the calculated Patient Ratio above and the CHD Risk Table to determine the patient's CHD Risk.        ATP III CLASSIFICATION (LDL):  <100     mg/dL   Optimal  [id number]  mg/dL   Near or Above                    Optimal  130-159  mg/dL   Borderline  [id number]  mg/dL   High  >469>190     mg/dL   Very High Performed at [facility], [address] [place], [place] [address]    Direct LDL 10/16/2021 128 (H)  0 - 99 mg/dL Final   Performed at [facility], [address] [place], [place] [address]  Admission on 10/11/2021, Discharged on 10/14/2021  Component Date Value Ref Range Status   Sodium 10/11/2021 133 (L)  135 - 145 mmol/L Final   Potassium 10/11/2021 4.3  3.5 - 5.1 mmol/L Final   Chloride 10/11/2021 98  98 - 111 mmol/L Final   CO2 10/11/2021 24  22 - 32 mmol/L Final   Glucose, Bld 10/11/2021 91  70 - 99 mg/dL Final   Glucose reference range applies only to samples taken after fasting for at least 8 hours.   BUN 10/11/2021 11  8 - 23 mg/dL Final   Creatinine, Ser 10/11/2021 0.92  0.61 - 1.24 mg/dL Final   Calcium 44/01/027209/04/2021 8.1 (L)  8.9 - 10.3 mg/dL Final   Total Protein 53/66/440309/04/2021 6.6  6.5 - 8.1 g/dL Final   Albumin 47/42/595609/04/2021 4.0  3.5 - 5.0 g/dL Final  AST 10/11/2021 31  15 - 41 U/L Final   ALT 10/11/2021 38  0 - 44 U/L Final   Alkaline Phosphatase 10/11/2021 73  38 - 126 U/L Final   Total Bilirubin 10/11/2021 0.7  0.3 - 1.2 mg/dL Final   GFR, Estimated 10/11/2021 >60  >60 mL/min Final   Comment: (NOTE) Calculated using the CKD-EPI Creatinine Equation (2021)    Anion gap 10/11/2021 11  5 - 15 Final   Performed at [facility], [address], [place], [place] [address]   Alcohol, Ethyl (B) 10/11/2021 294 (H)  <10 mg/dL Final   Comment: (NOTE) Lowest detectable limit for serum alcohol is 10 mg/dL.  For medical purposes only. Performed at [facility], [address], [place], [place] [address]    Salicylate Lvl 10/11/2021 <7.0 (L)  7.0 - 30.0 mg/dL Final   Performed at [facility], [address], [place], [place] [address]   Acetaminophen (Tylenol), Serum 10/11/2021 <10 (L)  10 - 30 ug/mL Final   Comment: (NOTE) Therapeutic concentrations vary significantly. A range of 10-30 ug/mL  may be an effective concentration for many patients. However, some  are best treated at concentrations outside of this range. Acetaminophen concentrations >150 ug/mL at 4 hours after ingestion  and  >50 ug/mL at 12 hours after ingestion are often associated with  toxic reactions.  Performed at [facility], [address], [place], [place] [address]    WBC 10/11/2021 5.2  4.0 - 10.5 K/uL Final   RBC 10/11/2021 4.71  4.22 - 5.81 MIL/uL Final   Hemoglobin 10/11/2021 14.6  13.0 - 17.0 g/dL Final   HCT 48/88/9169 43.9  39.0 - 52.0 % Final   MCV 10/11/2021 93.2  80.0 - 100.0 fL Final   MCH 10/11/2021 31.0  26.0 - 34.0 pg Final   MCHC 10/11/2021 33.3  30.0 - 36.0 g/dL Final   RDW 45/04/8880 13.2  11.5 - 15.5 % Final   Platelets 10/11/2021 193  150 - 400 K/uL Final   nRBC 10/11/2021 0.0  0.0 - 0.2 % Final   Performed at [facility], [address], [place], [place] [address]   SARS Coronavirus 2 by RT PCR 10/11/2021 NEGATIVE  NEGATIVE Final   Comment: (NOTE) SARS-CoV-2 target nucleic acids are NOT DETECTED.  The SARS-CoV-2 RNA is generally detectable in upper respiratory specimens during the acute phase of infection. The lowest concentration of SARS-CoV-2 viral copies this assay can detect is 138 copies/mL. A negative result does not preclude SARS-Cov-2 infection and should not be used as the sole basis for treatment or other patient management decisions. A negative result may occur with  improper specimen collection/handling, submission of specimen other than nasopharyngeal swab, presence of viral mutation(s) within the areas targeted by this assay, and inadequate number of viral copies(<138 copies/mL). A negative result must be combined with clinical observations, patient history, and epidemiological information. The expected result is Negative.  Fact Sheet for Patients:  BloggerCourse.com  Fact Sheet for Healthcare Providers:  SeriousBroker.it  This test is no                          t yet approved or cleared by the [place] FDA and  has been authorized for detection and/or  diagnosis of SARS-CoV-2 by FDA under an Emergency Use Authorization (EUA). This EUA will remain  in effect (meaning this test can be used) for  the duration of the COVID-19 declaration under Section 564(b)(1) of the Act, 21 U.S.C.section 360bbb-3(b)(1), unless the authorization is terminated  or revoked sooner.       Influenza A by PCR 10/11/2021 NEGATIVE  NEGATIVE Final   Influenza B by PCR 10/11/2021 NEGATIVE  NEGATIVE Final   Comment: (NOTE) The Xpert Xpress SARS-CoV-2/FLU/RSV plus assay is intended as an aid in the diagnosis of influenza from Nasopharyngeal swab specimens and should not be used as a sole basis for treatment. Nasal washings and aspirates are unacceptable for Xpert Xpress SARS-CoV-2/FLU/RSV testing.  Fact Sheet for Patients: BloggerCourse.com  Fact Sheet for Healthcare Providers: SeriousBroker.it  This test is not yet approved or cleared by the [place] FDA and has been authorized for detection and/or diagnosis of SARS-CoV-2 by FDA under an Emergency Use Authorization (EUA). This EUA will remain in effect (meaning this test can be used) for the duration of the COVID-19 declaration under Section 564(b)(1) of the Act, 21 U.S.C. section 360bbb-3(b)(1), unless the authorization is terminated or revoked.  Performed at [facility], [address], [place], [place] [address]    Opiates 10/11/2021 NONE DETECTED  NONE DETECTED Final   Cocaine 10/11/2021 NONE DETECTED  NONE DETECTED Final   Benzodiazepines 10/11/2021 NONE DETECTED  NONE DETECTED Final   Amphetamines 10/11/2021 NONE DETECTED  NONE DETECTED Final   Tetrahydrocannabinol 10/11/2021 NONE DETECTED  NONE DETECTED Final   Barbiturates 10/11/2021 NONE DETECTED  NONE DETECTED Final   Comment: (NOTE) DRUG SCREEN FOR MEDICAL PURPOSES ONLY.  IF CONFIRMATION IS NEEDED FOR ANY PURPOSE, NOTIFY LAB WITHIN 5 DAYS.  LOWEST DETECTABLE  LIMITS FOR URINE DRUG SCREEN Drug Class                     Cutoff (ng/mL) Amphetamine and metabolites    1000 Barbiturate and metabolites    200 Benzodiazepine                 200 Tricyclics and metabolites     300 Opiates and metabolites        300 Cocaine and metabolites        300 THC                            50 Performed at [facility], [address], [place], [place] [address]    Color, Urine 10/11/2021 COLORLESS (A)  YELLOW Final   APPearance 10/11/2021 CLEAR  CLEAR Final   Specific Gravity, Urine 10/11/2021 1.004 (L)  1.005 - 1.030 Final   pH 10/11/2021 6.0  5.0 - 8.0 Final   Glucose, UA 10/11/2021 NEGATIVE  NEGATIVE mg/dL Final   Hgb urine dipstick 10/11/2021 NEGATIVE  NEGATIVE Final   Bilirubin Urine 10/11/2021 NEGATIVE  NEGATIVE Final   Ketones, ur 10/11/2021 NEGATIVE  NEGATIVE mg/dL Final   Protein, ur 56/21/3086 NEGATIVE  NEGATIVE mg/dL Final   Nitrite 57/84/6962 NEGATIVE  NEGATIVE Final   Leukocytes,Ua 10/11/2021 NEGATIVE  NEGATIVE Final   Performed at [facility], [address] [place], [place] [address]  Admission on 08/29/2021, Discharged on 08/29/2021  Component Date Value Ref Range Status   Lipase 08/29/2021 33  11 - 51 U/L Final   Performed at [facility], [address] [place], [place] [address]   Sodium 08/29/2021 137  135 - 145 mmol/L Final   Potassium 08/29/2021 3.9  3.5 - 5.1 mmol/L Final   Chloride 08/29/2021  100  98 - 111 mmol/L Final   CO2 08/29/2021 20 (L)  22 - 32 mmol/L Final   Glucose, Bld 08/29/2021 78  70 - 99 mg/dL Final   Glucose reference range applies only to samples taken after fasting for at least 8 hours.   BUN 08/29/2021 8  8 - 23 mg/dL Final   Creatinine, Ser 08/29/2021 0.81  0.61 - 1.24 mg/dL Final   Calcium 76/73/4193 8.7 (L)  8.9 - 10.3 mg/dL Final   Total Protein 79/03/4095 6.8  6.5 - 8.1 g/dL Final   Albumin 35/32/9924 4.4  3.5 - 5.0 g/dL Final   AST 26/83/4196 100 (H)  15 -  41 U/L Final   ALT 08/29/2021 61 (H)  0 - 44 U/L Final   Alkaline Phosphatase 08/29/2021 72  38 - 126 U/L Final   Total Bilirubin 08/29/2021 0.6  0.3 - 1.2 mg/dL Final   GFR, Estimated 08/29/2021 >60  >60 mL/min Final   Comment: (NOTE) Calculated using the CKD-EPI Creatinine Equation (2021)    Anion gap 08/29/2021 17 (H)  5 - 15 Final   Performed at [facility], [address], [place], [place] [address]   WBC 08/29/2021 5.3  4.0 - 10.5 K/uL Final   RBC 08/29/2021 4.69  4.22 - 5.81 MIL/uL Final   Hemoglobin 08/29/2021 14.9  13.0 - 17.0 g/dL Final   HCT 98/92/1194 42.2  39.0 - 52.0 % Final   MCV 08/29/2021 90.0  80.0 - 100.0 fL Final   MCH 08/29/2021 31.8  26.0 - 34.0 pg Final   MCHC 08/29/2021 35.3  30.0 - 36.0 g/dL Final   RDW 17/40/8144 13.2  11.5 - 15.5 % Final   Platelets 08/29/2021 145 (L)  150 - 400 K/uL Final   nRBC 08/29/2021 0.0  0.0 - 0.2 % Final   Performed at [facility], [address] [place], [place] [address]   Color, Urine 08/29/2021 STRAW (A)  YELLOW Final   APPearance 08/29/2021 CLEAR  CLEAR Final   Specific Gravity, Urine 08/29/2021 1.003 (L)  1.005 - 1.030 Final   pH 08/29/2021 6.0  5.0 - 8.0 Final   Glucose, UA 08/29/2021 NEGATIVE  NEGATIVE mg/dL Final   Hgb urine dipstick 08/29/2021 NEGATIVE  NEGATIVE Final   Bilirubin Urine 08/29/2021 NEGATIVE  NEGATIVE Final   Ketones, ur 08/29/2021 NEGATIVE  NEGATIVE mg/dL Final   Protein, ur 31/49/7026 NEGATIVE  NEGATIVE mg/dL Final   Nitrite 37/85/8850 NEGATIVE  NEGATIVE Final   Leukocytes,Ua 08/29/2021 NEGATIVE  NEGATIVE Final   Performed at [facility], [address] [place], [place] [address]   Opiates 08/29/2021 NONE DETECTED  NONE DETECTED Final   Cocaine 08/29/2021 NONE DETECTED  NONE DETECTED Final   Benzodiazepines 08/29/2021 POSITIVE (A)  NONE DETECTED Final   Amphetamines 08/29/2021 NONE DETECTED  NONE DETECTED Final   Tetrahydrocannabinol 08/29/2021 NONE DETECTED  NONE DETECTED Final    Barbiturates 08/29/2021 NONE DETECTED  NONE DETECTED Final   Comment: (NOTE) DRUG SCREEN FOR MEDICAL PURPOSES ONLY.  IF CONFIRMATION IS NEEDED FOR ANY PURPOSE, NOTIFY LAB WITHIN 5 DAYS.  LOWEST DETECTABLE LIMITS FOR URINE DRUG SCREEN Drug Class                     Cutoff (ng/mL) Amphetamine and metabolites    1000 Barbiturate and metabolites    200 Benzodiazepine                 200 Tricyclics  and metabolites     300 Opiates and metabolites        300 Cocaine and metabolites        300 THC                            50 Performed at [facility], [address], [place], [place] [address]    Alcohol, Ethyl (B) 08/29/2021 249 (H)  <10 mg/dL Final   Comment: (NOTE) Lowest detectable limit for serum alcohol is 10 mg/dL.  For medical purposes only. Performed at [facility], [address], [place], [place] [address]     Blood Alcohol level:  Lab Results  Component Value Date   ETH 294 (H) 10/11/2021   ETH 249 (H) 08/29/2021    Metabolic Disorder Labs: No results found for: "HGBA1C", "MPG" No results found for: "PROLACTIN" Lab Results  Component Value Date   CHOL 191 10/16/2021   TRIG 430 (H) 10/16/2021   HDL 42 10/16/2021   CHOLHDL 4.5 10/16/2021   VLDL UNABLE TO CALCULATE IF TRIGLYCERIDE OVER 400 mg/dL 09/81/1914   LDLCALC UNABLE TO CALCULATE IF TRIGLYCERIDE OVER 400 mg/dL 78/29/5621    Therapeutic Lab Levels: No results found for: "LITHIUM" No results found for: "VALPROATE" No results found for: "CBMZ"  Physical Findings   PHQ2-9    Flowsheet Row ED from 10/14/2021 in [facility]  PHQ-2 Total Score 3  PHQ-9 Total Score 10      Flowsheet Row ED from 10/14/2021 in [facility] ED from 10/11/2021 in [facility] from 08/29/2021 in [facility] EMERGENCY DEPARTMENT  C-SSRS RISK CATEGORY No Risk Moderate Risk Moderate Risk         Musculoskeletal  Strength & Muscle Tone: within normal limits Gait & Station: normal Patient leans: N/A  Psychiatric Specialty Exam  Presentation  General Appearance: Appropriate for Environment; Casual; Fairly Groomed  Eye Contact:Fair  Speech:Clear and Coherent; Normal Rate  Speech Volume:Normal  Handedness:Right   Mood and Affect  Mood:Dysphoric  Affect:Appropriate; Congruent   Thought Process  Thought Processes:Coherent; Goal Directed  Descriptions of Associations:Intact  Orientation:Full (Time, Place and Person)  Thought Content:WDL; Logical     Hallucinations:Hallucinations: None  Ideas of Reference:None  Suicidal Thoughts:Suicidal Thoughts: No  Homicidal Thoughts:Homicidal Thoughts: No   Sensorium  Memory:Immediate Good; Recent Good  Judgment:Fair  Insight:Fair   Executive Functions  Concentration:Good  Attention Span:Good  Recall:Good  Fund of Knowledge:Good  Language:Good   Psychomotor Activity  Psychomotor Activity:Psychomotor Activity: Normal   Assets  Assets:Communication Skills; Desire for Improvement; Resilience   Sleep  Sleep:Sleep: Good   No data recorded  Physical Exam  Physical Exam Vitals and nursing note reviewed.  Constitutional:      General: He is not in acute distress.    Appearance: Normal appearance. He is normal weight. He is not ill-appearing or toxic-appearing.  HENT:     Head: Normocephalic and atraumatic.  Pulmonary:     Effort: Pulmonary effort is normal.  Musculoskeletal:        General: Normal range of motion.  Neurological:     General: No focal deficit present.     Mental Status: He is alert.    Review of Systems  Respiratory:  Negative for cough and shortness of breath.   Cardiovascular:  Negative for chest pain.  Gastrointestinal:  Negative for abdominal pain, constipation, diarrhea, nausea  and vomiting.  Neurological:  Negative for dizziness, weakness and headaches.   Blood  pressure 116/67, pulse 78, temperature 98.6 F (37 C), temperature source Oral, resp. rate 18, SpO2 95 %. There is no height or weight on file to calculate BMI.  Treatment Plan Summary: Daily contact with patient to assess and evaluate symptoms and progress in treatment and Medication management   Dylan Duke is a 61 yr old male who presented to [facility] on 9/3 wanting Detox, he was admitted to [facility] on 9/6.  PPHx is significant for Depression and EtOH Abuse, 2 Suicide Attempts (OD), 4 hospitalizations, and multiple Rehab Admissions.   Topher is continuing to do well with his Detox.  A is doing well on his Zoloft.  He we will start Antabuse today.  He continues to refuse the lactulose and have encouraged him to discuss this with his after discharge.  He finished his Klonopin taper last night.  We will not make any medication changes at this time.  He will discharge tomorrow so that he can have his things while awaiting a bed at [facility] and will call [facility] to see if a bed opens up sooner.   AUD/chronic benzo use: -Continue CIWA, last score= 0  9/9 0848 -Continue Librium 25 mg q6 PRN CIWA>10 -Continue Imodium 2-4 mg PRN diarrhea -Continue Zofran-ODT 4 mg q6 PRN nausea -Continue Thiamine 100 mg daily for nutritional supplementation -Continue Multivitamin daily for nutritional supplementation -Finished Klonopin taper last night. -Continue Gabapentin 300 mg TID -Start Antabuse 250 mg daily today             D/C'd Milk of Magnesia as a PRN med due to starting antabuse. -Continue Lactulose 20 mg BID.  Continues to refuse to take it    Substance abuse mood disorder: -Continue Zoloft 75 mg daily  -Continue Seroquel 100 mg QHS sleep     COPD: Stable, plan to continue umeclidinium bromide 1 puff daily as directed. Will follow up outpatient.     Bilateral DVTs: -Continue Eliquis  BID. Will follow up outpatient   -Continue PRN's: Tylenol, Maalox, Atarax, Trazodone    Lauro Franklin, MD 10/17/2021 11:50 AM

## 2021-10-17 NOTE — ED Notes (Signed)
Pt attended group meeting

## 2021-10-17 NOTE — Group Note (Signed)
Group Topic: Recovery Basics  Group Date: 10/17/2021 Start Time: 1100 End Time: 1200 Facilitators: Levander Campion  Department: [facility]  Number of Participants: 3  Group Focus: relapse prevention Treatment Modality:  Behavior Modification Therapy and Individual Therapy Interventions utilized were orientation, patient education, and problem solving Purpose: enhance coping skills and regain self-worth  Name: Jonatan Wilsey. Date of Birth: 03-31-1960  MR: [id number]    Level of Participation: minimal Quality of Participation: attentive and cooperative Interactions with others: N/A Mood/Affect: appropriate Triggers (if applicable): N/A Cognition: coherent/clear Progress: Moderate Response: N/A Plan: follow-up needed  Patients Problems:  Patient Active Problem List   Diagnosis Date Noted   Alcohol use disorder, severe, in early remission (HCC) 10/14/2021   Alcohol use disorder, severe, dependence (HCC) 10/14/2021   Alcohol abuse 10/11/2021   Major depressive disorder, recurrent episode, severe (HCC) 10/11/2021   Suicidal ideation

## 2021-10-17 NOTE — ED Notes (Signed)
Pt is awake and sitting in dayroom at this time eating lunch  

## 2021-10-17 NOTE — Discharge Instructions (Addendum)
Dear Dylan Duke",  Most effective treatment for your mental health disease involves BOTH a psychiatrist AND a therapist Psychiatrist to manage medications Therapist to help identify personal goals, barriers from those goals, and plan to achieve those goals by understanding emotions Please make regular appointments with an outpatient psychiatrist and other doctors once you leave the hospital (if any, otherwise, please see below for resources to make an appointment).  For therapy outside the hospital, please ask for these specific types of therapy: DBT ________________________________________________________  SAFETY  Dial 988 for National Suicide & Crisis Lifeline    Text [phone] for Crisis Text Line:     [facility] U[facility]ENT [facility]:  [address] FIRST FLOOR.  [place], [place] [address].  [phone]  Mobile Crisis Response Teams Listed by counties in vicinity of [facility] providers [facility] [phone] [facility] [phone] [facility] [phone] [facility] [phone] [place]                * [facility] [phone]                * [facility] [phone] [facility] [phone] [facility]  [phone] * [facility] [phone] ________________________________________________________  To see which pharmacy near you is the CHEAPEST for certain medications, please use GoodRx. It is free website and has a free phone app.    Also consider looking at [facility] $4.00 or [facility]'s $7.00 prescription list. Both are free to view if googled "[facility] $4 prescription" and "public's $7 prescription". These are set prices, no insurance required. [facility] low cost medications: $4-$15 for 30days  prescriptions or $10-$38 for 90days prescriptions  ________________________________________________________  For issues with sleep, please use this free app for insomnia called CBT-I. Let your doctors and therapists know so they can help with extra tips and tricks or for guidance and accountability. NO ADDS on the app.     ________________________________________________________  [facility] [address], SECOND FLOOR [place], [place] [address] [phone] OUTPATIENT Walk-in information: Please note, all walk-ins are first come & first serve, with limited number of availability.  Please note that to be eligible for services you must bring: ID or a piece of mail with your name [facility] address  Therapist for therapy:  Monday & Wednesdays: Please ARRIVE at 7:15 AM for registration Will START at 8:00 AM Every 1st & 2nd Friday of the month: Please ARRIVE at 10:15 AM for registration Will START at 1 PM - 5 PM  Psychiatrist for medication management: Monday - Friday:  Please ARRIVE at 7:15 AM for registration Will START at 8:00 AM  Regretfully, due to limited availability, please be aware that you may not been seen on the same day as walk-in. Please consider making an appoint or try again. Thank you for your patience and understanding.

## 2021-10-17 NOTE — ED Notes (Signed)
Pt sitting in dining room watching TV. A&O x4, calm and cooperative. Denies SI/HI/AVH. No signs of distress noted. Monitoring for safety.

## 2021-10-17 NOTE — ED Notes (Signed)
Pt is awake and alert.  Flat affect that brightens with engagement.  Mood congruent.  Pt Denies SI, HI or AVH.  Was given hygiene supplies and has taken a shower this morning.  No distress noted.  Staff will monitor q15 min for safety.

## 2021-10-18 NOTE — ED Notes (Signed)
Group offered 

## 2021-10-18 NOTE — ED Provider Notes (Signed)
FBC/OBS ASAP Discharge Summary  Date and Time: 10/18/2021 8:32 AM  Name: Dylan Duke.  MRN:  [id number]   Discharge Diagnoses:  Final diagnoses:  Alcohol use disorder, severe, in early remission (HCC)  Severe episode of recurrent major depressive disorder, without psychotic features (HCC)  Suicidal ideation    Subjective:  Dylan Duke is a 61 yr old male who presented to [facility] on 9/3 wanting Detox, he was admitted to [facility] on 9/6.  PPHx is significant for Depression and EtOH Abuse, 2 Suicide Attempts (OD), 4 hospitalizations, and multiple Rehab Admissions.  He reports that he is doing good today.  He reports that he slept good last night.  He reports his appetite is doing good.  He reports no SI, HI, or AVH.  He reports no withdrawal symptoms.  He reports no cravings.  He reports no side effects from his medications.  He reports that he called [facility] in [place] and that he will be doing an intake interview with them on Monday.  He states he thinks this will be better for him as it is a one year program.  He reports he is looking forward to going there hopefully on Thursday so that we will give him enough time to pack up his things.  Discussed that since he is on an antipsychotic he will need routine monitoring of- weight, A1c, Lipid Panel, CMP, CBC, and EKG.  Discussed if he notices any abnormal muscle movements/twitching he needs to contact his outpatient provider immediately.  Discussed with him what to do in the event of a future crisis.  Discussed that he can return to [facility], go to the [facility], go to the nearest ED, or call 911 or [phone].   He reported understanding and had no concerns.    Stay Summary:  Patient presented to [facility] ED on 9/3 complaining of SI and wanting to detox.  He was admitted to [facility] on 9/6 to undergo detox process.  He was continued on his home Zoloft and Seroquel while here and his Zoloft was titrated which helped provide relief from his depression.  While  here he was tapered off of his twice a day Klonopin and placed on a Librium taper to assist with this and for alcohol withdrawal.  He tolerated this well without any complications or issues.  He had originally planned to go to [facility] as they would have a bed open in 2 weeks, however, he was able to contact [facility] who will be able to take him next week.  He was discharged on 9/10.   Total Time spent with patient: 20 minutes  Past Psychiatric History: Depression and EtOH Abuse, 2 Suicide Attempts (OD), 4 hospitalizations, and multiple Rehab Admissions. Past Medical History: History reviewed. No pertinent past medical history. History reviewed. No pertinent surgical history. Family History: History reviewed. No pertinent family history. Family Psychiatric History: Reports No Known Social History:  Social History   Substance and Sexual Activity  Alcohol Use Not Currently     Social History   Substance and Sexual Activity  Drug Use Never    Social History   Socioeconomic History   Marital status: Divorced    Spouse name: Not on file   Number of children: Not on file   Years of education: Not on file   Highest education level: Not on file  Occupational History   Not on file  Tobacco Use   Smoking status: Some Days   Smokeless tobacco: Never  Vaping  Use   Vaping Use: Some days  Substance and Sexual Activity   Alcohol use: Not Currently   Drug use: Never   Sexual activity: Not on file  Other Topics Concern   Not on file  Social History Narrative   ** Merged History Encounter **       Social Determinants of Health   Financial Resource Strain: Not on file  Food Insecurity: Not on file  Transportation Needs: Not on file  Physical Activity: Not on file  Stress: Not on file  Social Connections: Not on file   SDOH:  SDOH Screenings   Depression (PHQ2-9): Medium Risk (10/17/2021)  Tobacco Use: High Risk (10/14/2021)    Tobacco Cessation:  A prescription for an  FDA-approved tobacco cessation medication provided at discharge  Current Medications:  Current Facility-Administered Medications  Medication Dose Route Frequency Provider Last Rate Last Admin   acetaminophen (TYLENOL) tablet 650 mg  650 mg Oral Q6H PRN Rankin, Shuvon B, NP   650 mg at 10/16/21 1349   albuterol (VENTOLIN HFA) 108 (90 Base) MCG/ACT inhaler 2 puff  2 puff Inhalation BID PRN Rankin, Shuvon B, NP       alum & mag hydroxide-simeth (MAALOX/MYLANTA) 200-200-20 MG/5ML suspension 30 mL  30 mL Oral Q4H PRN Rankin, Shuvon B, NP       apixaban (ELIQUIS) tablet 5 mg  5 mg Oral BID Rankin, Shuvon B, NP   5 mg at 10/17/21 2200   chlordiazePOXIDE (LIBRIUM) capsule 25 mg  25 mg Oral Q6H PRN Princess Bruins, DO       disulfiram (ANTABUSE) tablet 250 mg  250 mg Oral Daily Princess Bruins, DO   250 mg at 10/17/21 0914   folic acid (FOLVITE) tablet 1 mg  1 mg Oral Daily Rankin, Shuvon B, NP   1 mg at 10/17/21 0914   gabapentin (NEURONTIN) capsule 300 mg  300 mg Oral TID Rankin, Shuvon B, NP   300 mg at 10/17/21 2200   hydrOXYzine (ATARAX) tablet 25 mg  25 mg Oral Q6H PRN Princess Bruins, DO       lactulose (CHRONULAC) 10 GM/15ML solution 20 g  20 g Oral BID Rankin, Shuvon B, NP   20 g at 10/15/21 2203   loperamide (IMODIUM) capsule 2-4 mg  2-4 mg Oral PRN Princess Bruins, DO       multivitamin with minerals tablet 1 tablet  1 tablet Oral Daily Rankin, Shuvon B, NP   1 tablet at 10/17/21 0915   nicotine (NICODERM CQ - dosed in mg/24 hours) patch 21 mg  21 mg Transdermal Daily Princess Bruins, DO   21 mg at 10/17/21 0916   ondansetron (ZOFRAN-ODT) disintegrating tablet 4 mg  4 mg Oral Q6H PRN Princess Bruins, DO       QUEtiapine (SEROQUEL) tablet 100 mg  100 mg Oral QHS Rankin, Shuvon B, NP   100 mg at 10/17/21 2200   sertraline (ZOLOFT) tablet 75 mg  75 mg Oral Daily Princess Bruins, DO   75 mg at 10/17/21 0914   thiamine (VITAMIN B1) tablet 100 mg  100 mg Oral Daily Rankin, Shuvon B, NP   100 mg at 10/17/21 0914    traZODone (DESYREL) tablet 50 mg  50 mg Oral QHS PRN Princess Bruins, DO   50 mg at 10/17/21 2200   umeclidinium bromide (INCRUSE ELLIPTA) 62.5 MCG/ACT 1 puff  1 puff Inhalation Daily Nelly Rout, MD   1 puff at 10/18/21 0758   Current Outpatient Medications  Medication Sig Dispense Refill   albuterol (VENTOLIN HFA) 108 (90 Base) MCG/ACT inhaler Inhale 2 puffs into the lungs 2 (two) times daily as needed.     disulfiram (ANTABUSE) 250 MG tablet Take 1 tablet (250 mg total) by mouth daily. 30 tablet 0   ELIQUIS 5 MG TABS tablet Take 5 mg by mouth 2 (two) times daily.     folic acid (FOLVITE) 1 MG tablet Take 1 mg by mouth daily.     gabapentin (NEURONTIN) 300 MG capsule Take 300 mg by mouth 3 (three) times daily.     lactulose (CHRONULAC) 10 GM/15ML solution Take 20 g by mouth in the morning and at bedtime.     Multiple Vitamin (DAILY VITES) tablet Take 1 tablet by mouth daily.     nicotine (NICODERM CQ - DOSED IN MG/24 HOURS) 21 mg/24hr patch Place 1 patch (21 mg total) onto the skin daily. 28 patch 0   QUEtiapine (SEROQUEL) 100 MG tablet Take 100 mg by mouth at bedtime.     sertraline (ZOLOFT) 50 MG tablet Take 1.5 tablets (75 mg total) by mouth daily. 45 tablet 0   thiamine (VITAMIN B1) 100 MG tablet Take 100 mg by mouth daily.     Tiotropium Bromide Monohydrate 2.5 MCG/ACT AERS Inhale 2 puffs into the lungs daily.     traZODone (DESYREL) 50 MG tablet Take 1 tablet (50 mg total) by mouth at bedtime as needed for sleep. 30 tablet 0   umeclidinium bromide (INCRUSE ELLIPTA) 62.5 MCG/ACT AEPB Inhale 1 puff into the lungs daily. 30 each 0    PTA Medications: (Not in a hospital admission)      10/17/2021   10:15 AM  Depression screen PHQ 2/9  Decreased Interest 1  Down, Depressed, Hopeless 2  PHQ - 2 Score 3  Altered sleeping 3  Tired, decreased energy 1  Change in appetite 1  Feeling bad or failure about yourself  1  Trouble concentrating 1  Moving slowly or fidgety/restless 0   Suicidal thoughts 0  PHQ-9 Score 10  Difficult doing work/chores Somewhat difficult    Flowsheet Row ED from 10/14/2021 in [facility] ED from 10/11/2021 in [facility] from 08/29/2021 in [facility] EMERGENCY DEPARTMENT  C-SSRS RISK CATEGORY No Risk Moderate Risk Moderate Risk       Musculoskeletal  Strength & Muscle Tone: within normal limits Gait & Station: normal Patient leans: N/A  Psychiatric Specialty Exam  Presentation  General Appearance: Appropriate for Environment; Casual; Fairly Groomed  Eye Contact:Good  Speech:Clear and Coherent; Normal Rate  Speech Volume:Normal  Handedness:Right   Mood and Affect  Mood:Euthymic  Affect:Appropriate; Congruent   Thought Process  Thought Processes:Coherent; Goal Directed  Descriptions of Associations:Intact  Orientation:Full (Time, Place and Person)  Thought Content:WDL; Logical     Hallucinations:Hallucinations: None  Ideas of Reference:None  Suicidal Thoughts:Suicidal Thoughts: No  Homicidal Thoughts:Homicidal Thoughts: No   Sensorium  Memory:Immediate Good; Recent Good  Judgment:Good  Insight:Good   Executive Functions  Concentration:Good  Attention Span:Good  Recall:Good  Fund of Knowledge:Good  Language:Good   Psychomotor Activity  Psychomotor Activity:Psychomotor Activity: Normal   Assets  Assets:Communication Skills; Desire for Improvement; Resilience   Sleep  Sleep:Sleep: Good   No data recorded  Physical Exam  Physical Exam Constitutional:      General: He is not in acute distress.    Appearance: Normal appearance. He is normal weight. He is not ill-appearing or toxic-appearing.  HENT:     Head: Normocephalic and atraumatic.  Pulmonary:     Effort: Pulmonary effort is normal.  Musculoskeletal:        General: Normal range of motion.  Neurological:     General: No focal deficit present.      Mental Status: He is alert.    Review of Systems  Respiratory:  Negative for cough and shortness of breath.   Cardiovascular:  Negative for chest pain.  Gastrointestinal:  Negative for abdominal pain, constipation, diarrhea, nausea and vomiting.  Neurological:  Negative for dizziness, weakness and headaches.  Psychiatric/Behavioral:  Negative for depression, hallucinations and suicidal ideas. The patient is not nervous/anxious and does not have insomnia.    Blood pressure 118/70, pulse 85, temperature 98.6 F (37 C), temperature source Oral, resp. rate 18, SpO2 95 %. There is no height or weight on file to calculate BMI.  Demographic Factors:  Male, Caucasian, Low socioeconomic status, Living alone, and Unemployed  Loss Factors: NA  Historical Factors: Impulsivity  Risk Reduction Factors:   Positive therapeutic relationship and Positive coping skills or problem solving skills  Continued Clinical Symptoms:  Alcohol/Substance Abuse/Dependencies  Cognitive Features That Contribute To Risk:  None    Suicide Risk:  Minimal: No identifiable suicidal ideation and is future oriented.  Patients presenting with no risk factors but with morbid ruminations; may be classified as minimal risk based on the severity of the depressive symptoms.   Plan Of Care/Follow-up recommendations/Disposition:  Activity: as tolerated  Diet: heart healthy  Other: -Follow-up with your outpatient psychiatric provider -instructions on appointment date, time, and address (location) are provided to you in discharge paperwork.  -Take your psychiatric medications as prescribed at discharge - instructions are provided to you in the discharge paperwork  -Follow-up with outpatient primary care doctor and other specialists -for management of chronic medical disease, including: Elevated cholesterol, EtOH abuse.  You will need routine monitoring of your- weight, A1c, Lipid Panel, CMP, CBC, and EKG because of  your antipsychotic.   -Testing: Follow-up with outpatient provider for abnormal lab results: Elevated Cholesterol  -Recommend abstinence from alcohol, tobacco, and other illicit drug use at discharge.   -If your psychiatric symptoms recur, worsen, or if you have side effects to your psychiatric medications, call your outpatient psychiatric provider, 911, [phone] or go to the nearest emergency department.  -If suicidal thoughts recur, call your outpatient psychiatric provider, 911, [phone] or go to the nearest emergency department.    Lauro Franklin, MD 10/18/2021, 8:32 AM

## 2021-10-18 NOTE — ED Notes (Signed)
Pt asleep in bed. Respirations even and unlabored. Monitoring for safety.
